# Patient Record
Sex: Male | Born: 1960 | Race: White | Hispanic: No | Marital: Single | Smoking: Never smoker
Health system: Southern US, Community
[De-identification: ages and names within clinical notes are randomized; demographics above are authoritative.]

## PROBLEM LIST (undated history)

## (undated) DIAGNOSIS — Z7289 Other problems related to lifestyle: Secondary | ICD-10-CM

## (undated) DIAGNOSIS — I10 Essential (primary) hypertension: Secondary | ICD-10-CM

## (undated) DIAGNOSIS — I2510 Atherosclerotic heart disease of native coronary artery without angina pectoris: Secondary | ICD-10-CM

## (undated) DIAGNOSIS — E7800 Pure hypercholesterolemia, unspecified: Secondary | ICD-10-CM

## (undated) DIAGNOSIS — F1090 Alcohol use, unspecified, uncomplicated: Secondary | ICD-10-CM

## (undated) DIAGNOSIS — K279 Peptic ulcer, site unspecified, unspecified as acute or chronic, without hemorrhage or perforation: Secondary | ICD-10-CM

## (undated) HISTORY — DX: Atherosclerotic heart disease of native coronary artery without angina pectoris: I25.10

## (undated) HISTORY — DX: Essential (primary) hypertension: I10

## (undated) HISTORY — DX: Peptic ulcer, site unspecified, unspecified as acute or chronic, without hemorrhage or perforation: K27.9

## (undated) HISTORY — PX: OTHER SURGICAL HISTORY: SHX169

## (undated) HISTORY — DX: Pure hypercholesterolemia, unspecified: E78.00

---

## 1997-09-11 ENCOUNTER — Emergency Department (HOSPITAL_COMMUNITY): Admission: EM | Admit: 1997-09-11 | Discharge: 1997-09-12 | Payer: Self-pay | Admitting: Emergency Medicine

## 2007-04-19 ENCOUNTER — Other Ambulatory Visit: Payer: Self-pay

## 2007-04-19 ENCOUNTER — Ambulatory Visit: Payer: Self-pay | Admitting: Cardiology

## 2007-04-19 ENCOUNTER — Other Ambulatory Visit: Payer: Self-pay | Admitting: Emergency Medicine

## 2007-04-19 ENCOUNTER — Inpatient Hospital Stay (HOSPITAL_COMMUNITY): Admission: EM | Admit: 2007-04-19 | Discharge: 2007-04-22 | Payer: Self-pay | Admitting: Cardiology

## 2007-04-19 ENCOUNTER — Encounter: Payer: Self-pay | Admitting: Cardiology

## 2007-05-09 ENCOUNTER — Ambulatory Visit: Payer: Self-pay | Admitting: Cardiology

## 2007-05-24 ENCOUNTER — Encounter (HOSPITAL_COMMUNITY): Admission: RE | Admit: 2007-05-24 | Discharge: 2007-06-27 | Payer: Self-pay | Admitting: Cardiology

## 2007-06-11 ENCOUNTER — Ambulatory Visit: Payer: Self-pay | Admitting: Cardiology

## 2007-06-11 LAB — CONVERTED CEMR LAB
ALT: 27 units/L (ref 0–53)
AST: 21 units/L (ref 0–37)
Alkaline Phosphatase: 62 units/L (ref 39–117)
BUN: 8 mg/dL (ref 6–23)
CO2: 30 meq/L (ref 19–32)
Chloride: 110 meq/L (ref 96–112)
Cholesterol: 146 mg/dL (ref 0–200)
GFR calc non Af Amer: 86 mL/min
LDL Cholesterol: 85 mg/dL (ref 0–99)
Potassium: 4 meq/L (ref 3.5–5.1)
Total Bilirubin: 1.3 mg/dL — ABNORMAL HIGH (ref 0.3–1.2)
Total CHOL/HDL Ratio: 4
Triglycerides: 123 mg/dL (ref 0–149)

## 2007-06-13 ENCOUNTER — Ambulatory Visit: Payer: Self-pay | Admitting: Cardiology

## 2007-07-17 ENCOUNTER — Ambulatory Visit: Payer: Self-pay | Admitting: Cardiology

## 2007-07-20 ENCOUNTER — Ambulatory Visit: Payer: Self-pay | Admitting: Cardiology

## 2007-08-16 ENCOUNTER — Ambulatory Visit: Payer: Self-pay | Admitting: Cardiology

## 2007-08-28 ENCOUNTER — Encounter: Payer: Self-pay | Admitting: Cardiology

## 2007-08-28 ENCOUNTER — Ambulatory Visit: Payer: Self-pay

## 2008-02-06 ENCOUNTER — Ambulatory Visit: Payer: Self-pay | Admitting: Cardiology

## 2008-02-15 ENCOUNTER — Ambulatory Visit: Payer: Self-pay | Admitting: Cardiology

## 2008-02-15 LAB — CONVERTED CEMR LAB
Alkaline Phosphatase: 51 units/L (ref 39–117)
Bilirubin, Direct: 0.1 mg/dL (ref 0.0–0.3)
LDL Cholesterol: 78 mg/dL (ref 0–99)
Total Bilirubin: 1.5 mg/dL — ABNORMAL HIGH (ref 0.3–1.2)
VLDL: 21 mg/dL (ref 0–40)

## 2008-07-15 ENCOUNTER — Encounter (INDEPENDENT_AMBULATORY_CARE_PROVIDER_SITE_OTHER): Payer: Self-pay | Admitting: *Deleted

## 2008-08-30 DIAGNOSIS — E7800 Pure hypercholesterolemia, unspecified: Secondary | ICD-10-CM

## 2008-08-30 DIAGNOSIS — I10 Essential (primary) hypertension: Secondary | ICD-10-CM

## 2008-10-14 ENCOUNTER — Ambulatory Visit: Payer: Self-pay | Admitting: Cardiology

## 2008-11-27 ENCOUNTER — Ambulatory Visit: Payer: Self-pay | Admitting: Cardiology

## 2008-12-09 ENCOUNTER — Telehealth: Payer: Self-pay | Admitting: Cardiology

## 2008-12-10 LAB — CONVERTED CEMR LAB
Albumin: 4.1 g/dL (ref 3.5–5.2)
HDL: 45.8 mg/dL (ref >39.00)
LDL Cholesterol: 89 mg/dL (ref 0–99)
Total CHOL/HDL Ratio: 3
Triglycerides: 110 mg/dL (ref 0.0–149.0)
VLDL: 22 mg/dL (ref 0.0–40.0)

## 2009-04-27 ENCOUNTER — Ambulatory Visit: Payer: Self-pay | Admitting: Cardiology

## 2009-07-23 ENCOUNTER — Ambulatory Visit: Payer: Self-pay | Admitting: Cardiology

## 2009-07-27 ENCOUNTER — Encounter: Payer: Self-pay | Admitting: Cardiology

## 2009-07-27 LAB — CONVERTED CEMR LAB
AST: 28 units/L (ref 0–37)
Alkaline Phosphatase: 50 units/L (ref 39–117)
Bilirubin, Direct: 0.2 mg/dL (ref 0.0–0.3)
Cholesterol: 162 mg/dL (ref 0–200)
LDL Cholesterol: 82 mg/dL (ref 0–99)
Total Bilirubin: 1.1 mg/dL (ref 0.3–1.2)
Total CHOL/HDL Ratio: 3
VLDL: 27 mg/dL (ref 0.0–40.0)

## 2009-11-18 ENCOUNTER — Ambulatory Visit: Payer: Self-pay | Admitting: Cardiology

## 2010-01-04 ENCOUNTER — Telehealth (INDEPENDENT_AMBULATORY_CARE_PROVIDER_SITE_OTHER): Payer: Self-pay | Admitting: *Deleted

## 2010-01-08 ENCOUNTER — Encounter (INDEPENDENT_AMBULATORY_CARE_PROVIDER_SITE_OTHER): Payer: Self-pay | Admitting: *Deleted

## 2010-03-02 NOTE — Progress Notes (Signed)
Summary: refill  Phone Note Refill Request Message from:  Patient on January 04, 2010 1:14 PM  Refill of Lipitor need generic brand send to [facility] [address]  Initial call taken by: Judie Grieve,  January 04, 2010 1:15 PM  Follow-up for Phone Call        Rx called to pharmacy Lipitor generic form # 30 plus 6 refills. Vikki Ports  January 08, 2010 9:12 AM

## 2010-03-02 NOTE — Assessment & Plan Note (Signed)
Summary: [name]   Visit Type:  6 months follow up Primary Provider:  Everlene Other  CC:  no complaints.  History of Present Illness: Doing well.  Denies chest pain.  Wants to go back to atorvastatin when it goes generic.  Has had alot of issues with his mother.  He gets no symptoms when he gets out and about.    Current Medications (verified): 1)  Carvedilol 6.25 Mg Tabs (Carvedilol) .... Take One Tablet By Mouth Twice A Day 2)  Aspirin Ec 325 Mg Tbec (Aspirin) .... Take One Tablet By Mouth Daily 3)  Simvastatin 40 Mg Tabs (Simvastatin) .... Take One Tablet By Mouth Daily At Bedtime 4)  Multivitamins  Tabs (Multiple Vitamin) .... When Remembers 5)  Nitroglycerin 0.4 Mg Subl (Nitroglycerin) .... One Tablet Under Tongue Every 5 Minutes As Needed For Chest Pain---May Repeat Times Three 6)  Ranitidine Hcl 150 Mg Caps (Ranitidine Hcl) .... As Needed 7)  Vitamin B Complex-C   Caps (B Complex-C) .... Not Every Day 8)  Fish Oil 1000 Mg Caps (Omega-3 Fatty Acids) .... Not Every Day  Allergies (verified): No Known Drug Allergies  Vital Signs:  Patient profile:   50 year old male Height:      71 inches Weight:      185 pounds BMI:     25.90 Pulse rate:   71 / minute Pulse rhythm:   regular Resp:     18 per minute BP sitting:   106 / 80  (left arm) Cuff size:   large  Vitals Entered By: Vikki Ports (November 18, 2009 2:52 PM)  Physical Exam  General:  Well developed, well nourished, in no acute distress. Head:  normocephalic and atraumatic Eyes:  PERRLA/EOM intact; conjunctiva and lids normal. Lungs:  Clear bilaterally to auscultation and percussion. Heart:  PMI non displaced.  Normal S1 and S2.  No rub or gallop. Pulses:  pulses normal in all 4 extremities Extremities:  No clubbing or cyanosis. Neurologic:  Alert and oriented x 3.   EKG  Procedure date:  11/18/2009  Findings:      NSR.  WNL.  Impression & Recommendations:  Problem # 1:  CAD (ICD-414.00)  Stable overall.   Denies any symptoms.  We discussed ASA dosing and I told him he could take 81 mg.  Will do GXT on next office visit. His updated medication list for this problem includes:    Carvedilol 6.25 Mg Tabs (Carvedilol) .Marland Kitchen... Take one tablet by mouth twice a day    Aspirin Ec 325 Mg Tbec (Aspirin) .Marland Kitchen... Take one tablet by mouth daily    Nitroglycerin 0.4 Mg Subl (Nitroglycerin) ..... One tablet under tongue every 5 minutes as needed for chest pain---may repeat times three  His updated medication list for this problem includes:    Carvedilol 6.25 Mg Tabs (Carvedilol) .Marland Kitchen... Take one tablet by mouth twice a day    Aspirin Ec 325 Mg Tbec (Aspirin) .Marland Kitchen... Take one tablet by mouth daily    Nitroglycerin 0.4 Mg Subl (Nitroglycerin) ..... One tablet under tongue every 5 minutes as needed for chest pain---may repeat times three  Orders: EKG w/ Interpretation (93000)  Problem # 2:  HYPERCHOLESTEROLEMIA (ICD-272.0)  Near target on generic--wants to switch back when atorva becomes generic. His updated medication list for this problem includes:    Simvastatin 40 Mg Tabs (Simvastatin) .Marland Kitchen... Take one tablet by mouth daily at bedtime  His updated medication list for this problem includes:    Simvastatin 40  Mg Tabs (Simvastatin) .Marland Kitchen... Take one tablet by mouth daily at bedtime  Patient Instructions: 1)  Your physician recommends that you continue on your current medications as directed. Please refer to the Current Medication list given to you today. 2)  Your physician wants you to follow-up in:6 MONTHS  You will receive a reminder letter in the mail two months in advance. If you don't receive a letter, please call our office to schedule the follow-up appointment. 3)  Your physician has requested that you have an exercise tolerance test in 6 MONTHS.  For further information please visit [email or web address]  Please also follow instruction sheet, as given.

## 2010-03-02 NOTE — Letter (Signed)
Summary: Custom - Lipid  [facility], Main Office  [address]   [place], [place] [address]   Phone: [phone]  Fax: [phone]     July 27, 2009 MRN: [id number]   [name] [address] [place], [place]  [address]   Dear [name],  We have reviewed your cholesterol results.  They are as follows:     Total Cholesterol:    162 (Desirable: less than 200)       HDL  Cholesterol:     52.80  (Desirable: greater than 40 for men and 50 for women)       LDL Cholesterol:       82  (Desirable: less than 100 for low risk and less than 70 for moderate to high risk)       Triglycerides:       135.0  (Desirable: less than 150)  Our recommendations include:  All of these look slightly better than the last numbers.  Will continue at present.  TS   Call our office at the number listed above if you have any questions.  Lowering your LDL cholesterol is important, but it is only one of a large number of "risk factors" that may indicate that you are at risk for heart disease, stroke or other complications of hardening of the arteries.  Other risk factors include:   A.  Cigarette Smoking* B.  High Blood Pressure* C.  Obesity* D.   Low HDL Cholesterol (see yours above)* E.   Diabetes Mellitus (higher risk if your is uncontrolled) F.  Family history of premature heart disease G.  Previous history of stroke or cardiovascular disease    *These are risk factors YOU HAVE CONTROL OVER.  For more information, visit .  There is now evidence that lowering the TOTAL CHOLESTEROL AND LDL CHOLESTEROL can reduce the risk of heart disease.  The American Heart Association recommends the following guidelines for the treatment of elevated cholesterol:  1.  If there is now current heart disease and less than two risk factors, TOTAL CHOLESTEROL should be less than 200 and LDL CHOLESTEROL should be less than 100. 2.  If there is current heart disease or two or more risk factors, TOTAL  CHOLESTEROL should be less than 200 and LDL CHOLESTEROL should be less than 70.  A diet low in cholesterol, saturated fat, and calories is the cornerstone of treatment for elevated cholesterol.  Cessation of smoking and exercise are also important in the management of elevated cholesterol and preventing vascular disease.  Studies have shown that 30 to 60 minutes of physical activity most days can help lower blood pressure, lower cholesterol, and keep your weight at a healthy level.  Drug therapy is used when cholesterol levels do not respond to therapeutic lifestyle changes (smoking cessation, diet, and exercise) and remains unacceptably high.  If medication is started, it is important to have you levels checked periodically to evaluate the need for further treatment options.  Thank you,    [employer] Team

## 2010-03-02 NOTE — Miscellaneous (Signed)
Summary: update med  Clinical Lists Changes  Medications: Changed medication from SIMVASTATIN 40 MG TABS (SIMVASTATIN) Take one tablet by mouth daily at bedtime to LIPITOR 80 MG TABS (ATORVASTATIN CALCIUM) Take one tablet by mouth daily.

## 2010-03-02 NOTE — Assessment & Plan Note (Signed)
Summary: ROV   Primary Provider:  Everlene Other   History of Present Illness: Has occasional twinges of pain, but it moves around.   Does alot of walking, and [place]aks in the [place].  He has no pain.  Waiting for warm weather to come.  Tolerating medicines ok.  Seems to be having less symptoms on simvastatin than on Crestor.  He is a Occupational psychologist, and is on the phone.    Current Medications (verified): 1)  Carvedilol 6.25 Mg Tabs (Carvedilol) .... Take One Tablet By Mouth Twice A Day 2)  Aspirin Ec 325 Mg Tbec (Aspirin) .... Take One Tablet By Mouth Daily 3)  Simvastatin 40 Mg Tabs (Simvastatin) .... Take One Tablet By Mouth Daily At Bedtime 4)  Multivitamins  Tabs (Multiple Vitamin) .... When Remembers 5)  Nitroglycerin 0.4 Mg Subl (Nitroglycerin) .... One Tablet Under Tongue Every 5 Minutes As Needed For Chest Pain---May Repeat Times Three 6)  Ranitidine Hcl 150 Mg Caps (Ranitidine Hcl) .... As Needed 7)  Vitamin B Complex-C   Caps (B Complex-C) .... Not Every Day 8)  Fish Oil 1000 Mg Caps (Omega-3 Fatty Acids) .... Not Every Day  Allergies (verified): No Known Drug Allergies  Past History:  Past Medical History: Last updated: 08/30/2008 Current Problems:  CAD (ICD-414.00)- post percutaneous stenting of the left anterior descending artery with residual disease HYPERTENSION (ICD-401.9)-Borderline HYPERCHOLESTEROLEMIA (ICD-272.0)  Vital Signs:  Patient profile:   50 year old male Height:      71 inches Weight:      182 pounds BMI:     25.48 Pulse rate:   69 / minute Resp:     16 per minute BP sitting:   132 / 82  (right arm)  Vitals Entered By: Marrion Coy, CNA (April 27, 2009 4:27 PM)  Physical Exam  General:  Well developed, well nourished, in no acute distress. Head:  normocephalic and atraumatic Eyes:  PERRLA/EOM intact; conjunctiva and lids normal. Ears:  TM's intact and clear with normal canals and hearing Lungs:  Clear bilaterally to auscultation  and percussion. Heart:  PMI non displaced.  Normal S1 and S2.  No S4, no murmur, no rub. Abdomen:  Bowel sounds positive; abdomen soft and non-tender without masses, organomegaly, or hernias noted. No hepatosplenomegaly. Pulses:  pulses normal in all 4 extremities Extremities:  No clubbing or cyanosis. Neurologic:  Alert and oriented x 3.   EKG  Procedure date:  04/27/2009  Findings:      NSR.  WNL.  Impression & Recommendations:  Problem # 1:  CAD (ICD-414.00)  remains stable on a medical regimen.   His updated medication list for this problem includes:    Carvedilol 6.25 Mg Tabs (Carvedilol) .Marland Kitchen... Take one tablet by mouth twice a day    Aspirin Ec 325 Mg Tbec (Aspirin) .Marland Kitchen... Take one tablet by mouth daily    Nitroglycerin 0.4 Mg Subl (Nitroglycerin) ..... One tablet under tongue every 5 minutes as needed for chest pain---may repeat times three  Orders: EKG w/ Interpretation (93000)  Problem # 2:  HYPERCHOLESTEROLEMIA (ICD-272.0)  Need to recheck lipid and liver after a period of exercising more.   His updated medication list for this problem includes:    Simvastatin 40 Mg Tabs (Simvastatin) .Marland Kitchen... Take one tablet by mouth daily at bedtime  Orders: EKG w/ Interpretation (93000)  Problem # 3:  HYPERTENSION (ICD-401.9)  controlled on current regimen. His updated medication list for this problem includes:    Carvedilol 6.25 Mg  Tabs (Carvedilol) .Marland Kitchen... Take one tablet by mouth twice a day    Aspirin Ec 325 Mg Tbec (Aspirin) .Marland Kitchen... Take one tablet by mouth daily  Orders: EKG w/ Interpretation (93000)  Patient Instructions: 1)  Your physician recommends that you return for a FASTING LIPID and LIVER Panel in 3 MONTHS (414.01, 401.9, 272.0) Lab opens at 8:30, July 06, 2009  2)  Your physician recommends that you continue on your current medications as directed. Please refer to the Current Medication list given to you today. 3)  Your physician wants you to follow-up in:  6  MONTHS.  You will receive a reminder letter in the mail two months in advance. If you don't receive a letter, please call our office to schedule the follow-up appointment.

## 2010-06-15 NOTE — Assessment & Plan Note (Signed)
[facility]                            CARDIOLOGY OFFICE NOTE   NAME:Rita, Idrees A                         MRN:          [id number]  DATE:08/16/2007                            DOB:          06-26-1960    Mr. Pagnotta is in for a followup visit.  In general, he is doing really  quite well.  He lives just off a [place] broadcast [place], and goes to the school where  he walked this track several times.  He walks up to 4-5 times per week.  Occasionally, he will have some atypical chest pain while sitting  around.  It goes sometimes in the right axilla and left axillary is  brief, and is not associated with any midsternal chest discomfort,  diaphoresis, nausea, or other typical symptoms.  There is no definite  pattern and none of this is similar to what he has had previously.  The  patient's last lipid profile was done in May.  At that time, his total  cholesterol was 146.  His HDL was slightly low at 36.9.  His LDL  cholesterol was 85, and he was near target.  His liver function studies  were not abnormal.  His renal function studies were intact.  His  previous hemoglobin was within normal limits.   CURRENT MEDICATIONS:  1. Aspirin 325 mg daily.  2. Plavix 75 mg daily.  3. Coreg 6.25 mg b.i.d.  4. Lipitor 80 mg nightly.  5. Multivitamins.   PHYSICAL EXAMINATION:  GENERAL:  He is alert and oriented, in no acute  distress.  VITAL SIGNS:  The blood pressure is 110/70.  The pulse is 76.  RESPIRATORY:  The lung fields are clear.  CARDIAC:  Rhythm is regular.  There is not a significant S4 gallop.   IMPRESSION:  1. Prior myocardial infarction with subsequent percutaneous stenting      of the left anterior descending artery with residual disease with      40-50% narrowing of the ramus intermedius, 70-75% focal stenosis of      an atrioventricular circumflex, and 30% stenosis in the right      coronary territory.  2. Hypercholesterolemia, on lipid-lowering therapy.   PLAN:  1. Return to clinic in 3 months.  2. At that time, his aspirin may be reduced.  3. We will order a 2D echocardiogram to ensure that his overall LV      function has recovered.     Arturo Morton. Riley Kill, MD, [facility]  Electronically Signed    TDS/MedQ  DD: 08/17/2007  DT: 08/17/2007  Job #: [id number]

## 2010-06-15 NOTE — Assessment & Plan Note (Signed)
[facility]                            CARDIOLOGY OFFICE NOTE   NAME:Somma, Harveer A                         MRN:          [id number]  DATE:05/09/2007                            DOB:          1960/08/31    HISTORY OF PRESENT ILLNESS:  Mr. Stripling is in for a followup visit.  In  general, he has been getting along reasonably well, although he  complains of a little bit of low energy, fatigue and at times some  dizziness, especially when he stands up.  He denies any progressive  shortness of breath, and has not had recurrent angina pectoris.  The  patient presented with an acute myocardial infarction.  He had ST  elevation.  He underwent catheterization, and stenting of the LAD with a  non drug-eluting stent.  During his hospitalization he had elevated  cardiac markers with CPK MBs peaking at 273.  His LDL in the hospital  was 135.  His BUN and creatinine were normal at the time of discharge.  Since discharge from the hospital, he has been getting along well.  He  has a sedentary job, and feels like he is ready to go back to work.   CURRENT MEDICATIONS:  1. Aspirin 325 mg daily.  2. Plavix 75 mg daily.  3. Lisinopril 2.5 mg daily.  4. Coreg 6.25 mg p.o. b.i.d.  5. Lipitor 80 mg q.h.s.  6. Multivitamin daily.   PHYSICAL EXAMINATION:  GENERAL:  He is alert and oriented in no  distress.  VITAL SIGNS:  Blood pressure is 195/70 supine and 90/70 standing.  Groin  has healed.  CARDIAC:  Rhythm is regular with an S4 gallop.  LUNGS:  The lung fields are clear to auscultation and percussion.   STUDIES:  Electrocardiogram demonstrates evidence of an age  indeterminate anterior wall myocardial infarction.  These are compatible  with his recent infarction.   IMPRESSION:  1. Mild hypotension status post percutaneous coronary stenting of the      LAD  2. Hypercholesterolemia now on lipid lowering therapy  3..  Stable cardiac status.   PLAN:  1. Discontinue  Lisinopril.  2. Return to clinic in 2 weeks for an exercise treadmill study.  3. Lipid and liver profile and basic metabolic profile.   ADDENDUM:  We spent nearly 20 minutes filling out disability forms  today.     Arturo Morton. Riley Kill, MD, [facility]  Electronically Signed    TDS/MedQ  DD: 05/09/2007  DT: 05/10/2007  Job #: [id number]

## 2010-06-15 NOTE — H&P (Signed)
[name], [name] NO.:  [id number]   MEDICAL RECORD NO.:  [id number]          PATIENT TYPE:  OIB   LOCATION:  2899                         FACILITY:  [facility]   PHYSICIAN:  Joseph Morton. Riley Kill, MD, FACCDATE OF BIRTH:  [birth year]   DATE OF ADMISSION:  04/19/2007  DATE OF DISCHARGE:                              HISTORY & PHYSICAL   PRIMARY CARDIOLOGIST:  [facility]   PRIMARY CARE PHYSICIAN:  Dr. Francie Massing at [facility].   PATIENT PROFILE:  A 50 year old Caucasian male without prior history of  CAD.  Patient was in his usual state of health until approximately 3:30  this morning when he awoke with substernal chest pain, nausea and  vomiting x2.  He then went back to bed and got back up at about 9 a.m.  secondary to worsening chest pain, nausea and dyspnea.  He presented to  the [facility] ER at 10:57 a.m. where and ECG was performed  showing anterior ST segment elevation with inferior and lateral  reciprocal changes.  Code STEMI was activated and patient was treated  with 4 baby aspirin, morphine, nitroglycerin.  He was taken [facility] cath lab for emergent cardiac catheterization and  arrived her at 11:38 this morning.  He currently complains of 3-4 out of  10 bilateral arm pain.   ALLERGIES:  No known drug allergies.   HOME MEDICATIONS:  Atenolol daily.   FAMILY HISTORY:  Both parents are alive.  Mother has a history of CAD.  She is currently age 18.  Father has a history of CAD and MI in his 63s,  and he is currently 27.  He has a brother age 19 who had an MI about 8  months ago.   SOCIAL HISTORY:  He lives in [place] by himself.  He has never been  married and has no children.  He works in Clinical biochemist for [employer].  He denies any tobacco or drug use.  He drinks between 6-  and 12-pack per week.  He does not routinely exercise.   REVIEW OF SYSTEMS:  Positive for chest pain, shortness of  breath, nausea  and vomiting as outlined in the HPI.  Otherwise all systems reviewed and  negative.   PHYSICAL EXAMINATION:  VITAL SIGNS:  Heart rate 85, respirations 24,  blood pressure 157/116, pulse ox 99% on 2 L.  GENERAL:  Pleasant white male with complaints of 3-4 out of 10 bilateral  arm pain.  HEENT:  Normal.  NEURO:  He is grossly intact, nonfocal.  SKIN:  Warm and dry without lesions or masses.  NECK:  No bruits or JVD.  LUNGS:  Respirations are regular, unlabored.  Clear to auscultation.  CARDIAC:  Regular S1 and S2.  No S3.  Positive S4.  No murmurs.  ABDOMEN:  Round, soft, nontender, nondistended.  Bowel sounds present  x4.  EXTREMITIES:  Warm, dry, pink.  No clubbing, cyanosis or edema.  Dorsalis pedis and posterior tibial pulses 2+ and equal bilaterally.   Chest x-ray pending.  EKG shows sinus rhythm with 3 mm anterior ST  segment elevation with lateral and inferior reciprocal changes.  Lab  work is pending.   ASSESSMENT/PLAN:  1. Acute anterior ST elevation myocardial infarction.  Emergent      catheterization today.  Plan to add aspirin, Plavix, statin, beta      blocker, angiotensin-converting enzyme inhibitor.  Follow up 2-      dimensional echocardiogram to evaluate left ventricular function.      Eventual cardiac rehabilitation.  2. Hypertension.  Blood pressure markedly elevated in the acute      setting.  Will add beta blocker and angiotensin-converting enzyme      inhibitor.  3. Lipid status currently unknown.  Reportedly has a history of      hyperlipidemia but not on meds.  Check lipids and liver function      tests.  Add Lipitor 80.      [name], ANP      Joseph Morton. Riley Kill, MD, [facility]  Electronically Signed    CB/MEDQ  D:  04/19/2007  T:  04/19/2007  Job:  [id number]

## 2010-06-15 NOTE — Discharge Summary (Signed)
NAMEKRISTEN, [name] NO.:  [id number]   MEDICAL RECORD NO.:  [id number]          PATIENT TYPE:  INP   LOCATION:  3705                         FACILITY:  [facility]   PHYSICIAN:  Madolyn Frieze. Jens Som, MD, FACCDATE OF BIRTH:  02/24/1960   DATE OF ADMISSION:  04/19/2007  DATE OF DISCHARGE:  04/22/2007                         DISCHARGE SUMMARY - REFERRING   ADMITTING PHYSICIAN:  Arturo Morton. Riley Kill, MD, [facility].   DISCHARGE PHYSICIAN:  Madolyn Frieze. Jens Som, MD, [facility].   SUMMARY OF HISTORY:  Mr. [name] is a 50 year old male who was awakened at  3:30 in the morning with substernal chest discomfort associated with  nausea and vomiting.  He was able to return to sleep, however, again at  9 a.m. he awoke to worsening chest discomfort.  He presented to [facility] at 10:57 a.m. where an EKG showed ST-segment elevation  anteriorly.  Code STEMI was called and he was rushed emergently to the  cardiac catheterization.  His history is notable for hypertension and  hyperlipidemia and a bleeding ulcer in the 1980s.   LABORATORY DATA:  Subsequent EKGs showed evolving anterior myocardial  infarction.  Admission H and H was 16.8 and 47.7, normal indices,  platelets 221, WBCs 11.6, PTT 30, PT 12.5, sodium 139, potassium 4.4,  BUN 7, creatinine 1.30.  LFTs showed a total bilirubin slightly elevated  at 1.4 and an AST at 38.  On March 22 at the time of discharge sodium  was 138, potassium 4.7, BUN 14, creatinine 1.01, glucose 91.  Initial CK-  MB was 1773 and 273.0 and a relative index of 15.4.  Troponin was 33.16.  Subsequent CK-MBs and troponins were declining.  BNP on admission was  139.  Fasting lipids showed a total cholesterol 208, triglycerides 155,  HDL 42, LDL 35.  A chest x-ray on the 19th was felt to be normal.   HOSPITAL COURSE:  Dr. Riley Kill performed emergent cardiac  catheterization.  His ejection fraction was noted to be 35% with  anterior apical and inferior dyskinesis.   Dr. Riley Kill performed bare  metal stenting to the proximal LAD reducing this from 100% to 0%.  His  residual coronary artery disease was felt to need continued medical  treatment.  An echocardiogram on the 19th showed an EF of 55%, mid  distal anterior septal akinesis.  Apical akinesis, moderate diastolic  dysfunction, trivial TR.  He was placed into the protection AMI trial.  Post sheath removal and bedrest catheterization site was intact thus he  was ambulating without difficulty.  Cardiac rehab assisted with  education and ambulation.  financial counsel also contacted the patient  and a family member to assist with needs.  Dr. Antoine Poche educated the  patient about limiting his ETOH and on the 22nd Dr. Jens Som felt that  he could be discharged home.   DISCHARGE DIAGNOSES:  1. Anterior myocardial infarction.  2. Three vessel nonobstructive coronary artery disease.  3. Status post bare metal stent to the LAD.  4. Hypertension.  5. Hyperlipidemia.  6. Alcohol use.   PROCEDURES PERFORMED:  Cardiac catheterization  with bare metal stenting  to the proximal LAD, continued medical treatment for a residual three  vessel nonobstructive disease.   DISPOSITION:  The patient is discharged home and asked to maintain a low  salt, heart healthy diet.  He was told not to return to work until after  he sees a Development worker, community.  Wound care is per supplemental sheet.  He was  advised no lifting, driving, sexual activity or heavy exertion for 2  weeks.   PRESCRIPTIONS:  1. He received new prescriptions for aspirin 325 mg daily.  2. Plavix 75 mg daily.  3. Lisinopril 2.5 mg daily.  4. Coreg 6.25 mg b.i.d.  5. Lipitor 80 mg daily at bedtime.  6. Nitroglycerin 0.4 as needed.   He will need blood work in 6-8 weeks in regards to FLP and LFTs.  He was  asked to bring all medications to all appointments.  He was advised not  to take his atenolol.  Discharge time 35 minutes.      Joellyn Rued,  PA-C      Madolyn Frieze Jens Som, MD, [facility]  Electronically Signed    EW/MEDQ  D:  04/22/2007  T:  04/22/2007  Job:  [id number]   cc:   Francie Massing, Dr

## 2010-06-15 NOTE — Cardiovascular Report (Signed)
NAMETEAGHAN, FORMICA NO.:  [id number]   MEDICAL RECORD NO.:  [id number]          PATIENT TYPE:  INP   LOCATION:  2901                         FACILITY:  [facility]   PHYSICIAN:  Arturo Morton. Riley Kill, MD, FACCDATE OF BIRTH:  1960/04/19   DATE OF PROCEDURE:  04/19/2007  DATE OF DISCHARGE:                            CARDIAC CATHETERIZATION   INDICATIONS:  Mr. Lightner is a very pleasant 50 year old gentleman who  presents with an acute anterior wall infarction.  He was picked up by  EMS.  He had been having chest pain for several hours.  It had started  last night, gone away, and then resumed this morning.  He presented to  the [facility] emergency room.  The EKG was placed, although the limb  leads were reversed.  However anterior precordial leads were consistent  with an anterior wall infarction, and he was brought emergently to the  cardiac catheterization laboratory at [facility].   On arrival here, he was evaluated carefully.  He was electively enrolled  in the PROTECTION AMI protocol.  Catheterization procedure was explained  to the patient.   PROCEDURE:  1. Left heart catheterization  2. Selective coronary arteriography.  3. Selective left ventriculography.  4. Percutaneous stenting of the left anterior descending artery.   DESCRIPTION OF THE PROCEDURE:  The patient was brought to the  catheterization laboratory and prepped and draped in the usual fashion.  Through an anterior puncture, the femoral artery was easily entered and  a 6-French sheath was placed.  We then did views of the left and right  coronary arteries.  Following this, preparations were made for  percutaneous intervention.  Bivalirudin was given according to protocol.  Heparin had not been given in the field.  Oral clopidogrel was also  administered.  A JL-35 guiding catheter was utilized to intubate the  left main.  We waited for the ACT to reach 200 seconds, and then a  Prowater wire  was placed down the artery and across the site of total  occlusion.  Following this, dilatation was done with a 2.0 x 15 Maverick  balloon.  Re-establishment of flow was achieved with this.  There was  evidence of vessel dissection from the original heart attack.  Following  this, we carefully measured the length of the lesion, and elected to  place a 2.75 x 28 Vision stent.  The Vision stent was then placed down  the artery and inflated to 15 atmospheres.  There was marked improvement  in the appearance of the artery with establishment of TIMI III flow.  Following this, post dilatations were done with a 3.25 Quantum Maverick  up to 16 atmospheres in the central portion of the stent particularly.  The entire stent was post dilated.  Following this, we gave  intracoronary verapamil.  Central aortic and left ventricular pressures  were then measured with a pigtail and ventriculography was performed in  the RAO projection.  The patient's symptoms were relieved.  He was  subsequently taken off the table, taken to the CCU in satisfactory  clinical condition.  During the course of the procedure, the patient was  randomized in the PROTECTION AMI trial.   HEMODYNAMIC DATA:  1. Central aortic pressure was 140/99, mean 118.  2. Left ventricular pressure was 145/34.  Importantly, the patient was      given intravenous metoprolol, intracoronary nitroglycerin and      intracoronary verapamil during the course of the procedure to      improve coronary flow and to bring pressure down.   ANGIOGRAPHIC DATA:  1. The left main is free of critical disease.  2. The LAD is occluded after the diagonal.  The diagonal itself has      about 40% narrowing.  The area of occlusion is a long segmentally      diseased area.  Following balloon dilatation and stenting with a 28-      mm stent, there was re-establishment of TIMI III flow, and also      marked improvement in the appearance of the artery with a  reduction      from 100 to 0%.  Just distal to the stent site is an area of mild      luminal irregularity of about 30%, and with reperfusion there was      evidence of an apical 40% stenosis.  3. Circumflex demonstrates a ramus intermedius that has about 40%      narrowing, then a 50% lesion.  The AV circumflex has a focal 70-75%      stenosis and diffuse luminal irregularity distally.  4. The right coronary artery demonstrates about a 30% mid stenosis and      has diffuse luminal irregularity but no critical lesions.  5. Ventriculography in the RAO projection reveals an ejection fraction      that appears to be in the range of about 35%.  There is dyskinesis      of the anteroapical segment and distal inferior segment.  I would      estimate the ejection fraction at between 35 and 40%.   The patient arrived at [facility] at 11:16, he was in the  catheterization laboratory at 11:39, and balloon inflation was performed  at 12:14.  This yields a door to balloon time of 58 minutes including  interhospital transfer.  Cath lab to balloon time was 35 minutes.   CONCLUSIONS:  1. Acute anterior wall myocardial infarction due to mid occlusion of      the left anterior descending artery.  2. Successful reperfusion with stenting of the LAD with a non drug-      eluting platform with post dilatation using a 3.25 Quantum Maverick      balloon.  3. Residual disease of the circumflex.  4. Moderately severe reduction in global left ventricular function      with an estimate of the ejection fraction of 35-40%.   PLAN:  1. The patient will be treated with aspirin and Plavix.  2. ACE inhibition will be begun immediately.  3. Beta blockade with Coreg will be administered.  4. Lipitor will be given at 80 mg.  5. Cardiac rehabilitation will be recommended.      Arturo Morton. Riley Kill, MD, [facility]  Electronically Signed     TDS/MEDQ  D:  04/19/2007  T:  04/19/2007  Job:  [id number]   cc:    Tracey Harries, M.D.

## 2010-06-15 NOTE — Procedures (Signed)
[facility]                              EXERCISE TREADMILL   NAME:Ayoub, Gilmore A                         MRN:          [id number]  DATE:06/13/2007                            DOB:          10-21-1960    Mr. Aburto exercised today on the Bruce protocol.  Blood pressure rose  appropriately.  Heart rate went from 75 at rest to 146 which represents  83% of age-predicted maximum.  He experienced no chest pain.  The  resting electrocardiogram demonstrated normal sinus rhythm, and at  maximum stress, there were absolutely no ST-segment changes.  The  patient has been in the cardiac rehabilitation program.  The patient has  also had recent lipid profile with an LDL of 85, triglycerides 123,  total cholesterol 146.  His liver function studies are within normal  limits.  His renal function studies are normal as well.   The patient is a 50 year old who presented with an anterior wall  infarction.  He was picked up by EMS.  He had a 2.75 x 28 Vision non  drug-eluting stent placed with marked improvement in TIMI flow.  He had  aggressive post dilatation with no evidence of edge tear.  He was  randomized in the protection AMI trial.  His coronary angiogram revealed  a long area of segmental disease that was successfully stented.  There  was mild luminal irregularity distally with 30% narrowing and 40% apical  stenosis.  There was a ramus intermedius that had 40% narrowing, then a  50% lesion, and the AV circumflex had a focal 70-75% stenosis and  diffuse luminal irregularity.  The right coronary demonstrated about 30%  narrowing.  The patient has been treated with aspirin and Plavix, and  beta blockade.  He has been in the rehab program.  He has been placed on  Lipitor and has been tolerating this reasonably well.  His lisinopril  has been discontinued because of his borderline blood pressure which we  did approximately 2-3 weeks ago.  Based upon the current findings,  we  will continue to recommend medical therapy.  He will be seen in followup  in 6 weeks to 3 months' time.     Arturo Morton. Riley Kill, MD, [facility]  Electronically Signed    TDS/MedQ  DD: 06/17/2007  DT: 06/17/2007  Job #: [id number]

## 2010-06-15 NOTE — Assessment & Plan Note (Signed)
[facility]                            CARDIOLOGY OFFICE NOTE   NAME:Furgerson, Tige A                         MRN:          [id number]  DATE:02/06/2008                            DOB:          August 29, 1960    Mr. Neuner is in for followup visit.  In general, he has been doing  really quite well.  He has resumed drinking some of his beer, however.  In general, he said he did this during the past year.  His last  echocardiogram does demonstrate some improvement in his overall LV  function with an ejection fraction now in the 55% range.   CURRENT MEDICATIONS:  1. Aspirin 325 mg daily.  2. Plavix 75 mg daily.  3. Coreg 6.25 mg p.o. b.i.d.  4. Lipitor 80 mg nightly.  5. Multivitamin daily.   PHYSICAL EXAMINATION:  GENERAL:  He is alert and oriented, in no  distress.  VITAL SIGNS:  Blood pressure 131/97 and pulse 72.  LUNGS:  Lung fields are clear.  CARDIAC:  Rhythm is regular with an S4 gallop.  EXTREMITIES:  No edema.   Electrocardiogram demonstrates normal sinus rhythm essentially within  normal limits.  When compared to the previous tracing, his entire  anterior T-wave inversion is essentially resolved.  It is now improved.   IMPRESSION:  1. Coronary artery disease, status post percutaneous stenting of the      left anterior descending artery with residual disease, 40-50% of      the ramus intermedius, 70-75% of the atrioventricular circumflex,      and 30% stenosis of the right.  2. Hypercholesterolemia, on lipid-lowering therapy, near target with      LDL of 85 and HDL of 36.  3. Hypertension, borderline control.   RECOMMENDATIONS:  1. We had a discussion today about his beer ingestion, reduction of      this would be highly recommended.  2. We will see him back in followup in 6 months.  At the end of 1      year, the patient will be able to cut back on his Plavix and resume      only aspirin a day, and we will reduce this to 81 mg.  3. He  will need to keep an eye on his blood pressure which we will      check when he returns.     Arturo Morton. Riley Kill, MD, [facility]  Electronically Signed   TDS/MedQ  DD: 02/06/2008  DT: 02/07/2008  Job #: [id number]

## 2010-07-08 ENCOUNTER — Other Ambulatory Visit: Payer: Self-pay | Admitting: *Deleted

## 2010-07-08 MED ORDER — SIMVASTATIN 40 MG PO TABS: 40.0000 mg | ORAL_TABLET | Freq: Every evening | ORAL | Status: DC

## 2010-08-09 ENCOUNTER — Telehealth: Payer: Self-pay | Admitting: Cardiology

## 2010-08-09 MED ORDER — NITROGLYCERIN 0.4 MG SL SUBL: 0.4000 mg | SUBLINGUAL_TABLET | SUBLINGUAL | Status: DC | PRN

## 2010-08-09 MED ORDER — CARVEDILOL 6.25 MG PO TABS: 6.2500 mg | ORAL_TABLET | Freq: Two times a day (BID) | ORAL | Status: DC

## 2010-08-09 NOTE — Telephone Encounter (Signed)
Pharmacy calling needing refills for the following :  Carteolol 6.25mg   Nitrostat 0.4mg 

## 2010-10-25 LAB — CBC
HCT: 44.2
MCHC: 34.8
MCHC: 35.3
MCV: 90.7
RBC: 4.87
RBC: 5.34

## 2010-10-25 LAB — POCT I-STAT, CHEM 8
HCT: 46
Hemoglobin: 15.6
Potassium: 4.1
Sodium: 138

## 2010-10-25 LAB — DIFFERENTIAL
Eosinophils Absolute: 0.1
Eosinophils Relative: 1
Lymphs Abs: 1.3
Monocytes Relative: 5

## 2010-10-25 LAB — BASIC METABOLIC PANEL
BUN: 14
CO2: 28
CO2: 30
Calcium: 8.7
Calcium: 8.9
Creatinine, Ser: 0.99
Creatinine, Ser: 1.01
GFR calc Af Amer: >60
GFR calc Af Amer: >60
Glucose, Bld: 91

## 2010-10-25 LAB — LIPID PANEL
HDL: 42
Triglycerides: 155 — ABNORMAL HIGH
VLDL: 31

## 2010-10-25 LAB — COMPREHENSIVE METABOLIC PANEL
ALT: 33
AST: 38 — ABNORMAL HIGH
CO2: 27
Calcium: 10.6 — ABNORMAL HIGH
GFR calc Af Amer: >60
GFR calc non Af Amer: 59 — ABNORMAL LOW
Sodium: 139
Total Protein: 7.6

## 2010-10-25 LAB — CARDIAC PANEL(CRET KIN+CKTOT+MB+TROPI)
CK, MB: 273 — ABNORMAL HIGH
Total CK: 1773 — ABNORMAL HIGH
Total CK: 865 — ABNORMAL HIGH
Troponin I: 24

## 2010-10-25 LAB — POCT CARDIAC MARKERS
CKMB, poc: 12.5
Myoglobin, poc: 127

## 2010-12-03 ENCOUNTER — Encounter: Payer: Self-pay | Admitting: *Deleted

## 2010-12-07 ENCOUNTER — Encounter: Payer: Self-pay | Admitting: Cardiology

## 2010-12-07 ENCOUNTER — Ambulatory Visit (INDEPENDENT_AMBULATORY_CARE_PROVIDER_SITE_OTHER): Payer: 59 | Admitting: Cardiology

## 2010-12-07 DIAGNOSIS — I2510 Atherosclerotic heart disease of native coronary artery without angina pectoris: Secondary | ICD-10-CM

## 2010-12-07 DIAGNOSIS — E7800 Pure hypercholesterolemia, unspecified: Secondary | ICD-10-CM

## 2010-12-07 DIAGNOSIS — I10 Essential (primary) hypertension: Secondary | ICD-10-CM

## 2010-12-07 MED ORDER — CARVEDILOL 6.25 MG PO TABS: 6.2500 mg | ORAL_TABLET | Freq: Two times a day (BID) | ORAL | Status: DC

## 2010-12-07 MED ORDER — SIMVASTATIN 40 MG PO TABS: 40.0000 mg | ORAL_TABLET | Freq: Every evening | ORAL | Status: DC

## 2010-12-07 NOTE — Patient Instructions (Signed)
Your physician recommends that you return for lab work in: fasting cholesterol, lipid  Your physician wants you to follow-up in: 6 months with Dr. Riley Kill. You will receive a reminder letter in the mail two months in advance. If you don't receive a letter, please call our office to schedule the follow-up appointment.

## 2010-12-09 ENCOUNTER — Other Ambulatory Visit (INDEPENDENT_AMBULATORY_CARE_PROVIDER_SITE_OTHER): Payer: 59 | Admitting: *Deleted

## 2010-12-09 DIAGNOSIS — E7800 Pure hypercholesterolemia, unspecified: Secondary | ICD-10-CM

## 2010-12-09 LAB — HEPATIC FUNCTION PANEL
ALT: 31 U/L (ref 0–53)
AST: 23 U/L (ref 0–37)
Albumin: 4.4 g/dL (ref 3.5–5.2)
Alkaline Phosphatase: 59 U/L (ref 39–117)
Bilirubin, Direct: 0.2 mg/dL (ref 0.0–0.3)
Total Protein: 7.1 g/dL (ref 6.0–8.3)

## 2010-12-09 LAB — LIPID PANEL
Total CHOL/HDL Ratio: 3
Triglycerides: 131 mg/dL (ref 0.0–149.0)

## 2010-12-12 NOTE — Progress Notes (Signed)
HPI:  Mr. [name] is in for follow up.  He has had no tightness in the chest with exertion.  He has noted some epigastric discomfort from time to time, and it is relieved with Pepto-Bismol.  It happened like this in 2004--somehow related to caffeine he thought.  It also goes away with water.  He is sure this is not what he had during his MI.  He is able to ambulate without limitation.  Current Outpatient Prescriptions  Medication Sig Dispense Refill  . aspirin 325 MG tablet Take 325 mg by mouth daily.        . carvedilol (COREG) 6.25 MG tablet Take 1 tablet (6.25 mg total) by mouth 2 (two) times daily.  60 tablet  11  . nitroGLYCERIN (NITROSTAT) 0.4 MG SL tablet Place 1 tablet (0.4 mg total) under the tongue every 5 (five) minutes as needed for chest pain.  25 tablet  9  . ranitidine (ZANTAC) 150 MG capsule Take 150 mg by mouth as needed.        . simvastatin (ZOCOR) 40 MG tablet Take 1 tablet (40 mg total) by mouth every evening.  30 tablet  6    Allergies not on file  Past Medical History  Diagnosis Date  . Coronary artery disease   . Hypertension   . Hypercholesteremia     Past Surgical History  Procedure Date  . Coronary stent placement     of the left anterior descending artery    Family History  Problem Relation Age of Onset  . Coronary artery disease Mother 48    alive  . Coronary artery disease Father     had MI in his 16's  . Heart attack Brother 51    alive    History   Social History  . Marital Status: Single    Spouse Name: N/A    Number of Children: N/A  . Years of Education: N/A   Occupational History  . 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 Smoking status: Never Smoker   . Smokeless tobacco: Not on file  . Alcohol Use: Yes     drinks between 6-12 pack per week  . Drug Use: No  . Sexually Active: Not on file   Other Topics Concern  . Not on file   Social History Narrative  . No narrative on file    ROS: Please see the  HPI.  All other systems reviewed and negative.  PHYSICAL EXAM:  BP 126/86  Pulse 76  Ht 5\' 10"  (1.778 m)  Wt 85.276 kg (188 lb)  BMI 26.98 kg/m2  General: Well developed, well nourished, in no acute distress. Head:  Normocephalic and atraumatic. Neck: no JVD Lungs: Clear to auscultation and percussion. Heart: Normal S1 and S2.  No murmur, rubs or gallops.  Abdomen:  Normal bowel sounds; soft; non tender; no organomegaly Pulses: Pulses normal in all 4 extremities. Extremities: No clubbing or cyanosis. No edema. Neurologic: Alert and oriented x 3.  EKG:  NSR. WNL.   ASSESSMENT AND PLAN:

## 2010-12-14 NOTE — Assessment & Plan Note (Addendum)
Seems to be doing well.  Prior stent to the mid LAD.  Follow up echo showed substantial recovery of LV.  No typical symptoms of angina at present.  Continue ambulation and reg exercise. Suggested he reduce ASA to 81 mg per day.

## 2010-12-14 NOTE — Assessment & Plan Note (Signed)
Overdo for lipid and liver profile.  Will check.

## 2010-12-14 NOTE — Assessment & Plan Note (Signed)
Appears controlled.  Continue meds.

## 2010-12-16 DIAGNOSIS — I2510 Atherosclerotic heart disease of native coronary artery without angina pectoris: Secondary | ICD-10-CM

## 2010-12-16 NOTE — Telephone Encounter (Signed)
[name] He is getting a lipid, but can you set him up for a routine GXT. Thanks. TS  Notes Recorded by Shawnie Pons, MD on 12/14/2010 at 10:55 AM Please let him know his LDL is pretty good. He is close to target at this point in time. Target is 70. If he wants to get here, we could change his simva to atrovastatin 40mg  daily, and recheck lipid and liver in six weeks. TS  I left a message for the pt to call back. Order placed for GXT.

## 2010-12-22 ENCOUNTER — Telehealth: Payer: Self-pay

## 2010-12-22 DIAGNOSIS — E7800 Pure hypercholesterolemia, unspecified: Secondary | ICD-10-CM

## 2010-12-22 MED ORDER — ATORVASTATIN CALCIUM 40 MG PO TABS: 40.0000 mg | ORAL_TABLET | Freq: Every day | ORAL | Status: DC

## 2010-12-22 NOTE — Telephone Encounter (Signed)
error 

## 2010-12-22 NOTE — Telephone Encounter (Signed)
I spoke with the pt and reviewed the pt's lab results.   Note from Dr Riley Kill: Notes Recorded by Shawnie Pons, MD on 12/14/2010 at 10:55 AM Please let him know his LDL is pretty good. He is close to target at this point in time. Target is 70. If he wants to get here, we could change his simva to atrovastatin 40mg  daily, and recheck lipid and liver in six weeks. TS  The pt will stop Simvastatin and start Atorvastatin 40mg  daily.  The pt will have a lipid and liver rechecked on 02/03/11.

## 2011-01-20 ENCOUNTER — Ambulatory Visit (INDEPENDENT_AMBULATORY_CARE_PROVIDER_SITE_OTHER): Payer: 59 | Admitting: Physician Assistant

## 2011-01-20 DIAGNOSIS — I2510 Atherosclerotic heart disease of native coronary artery without angina pectoris: Secondary | ICD-10-CM

## 2011-01-20 NOTE — Progress Notes (Signed)
Exercise Treadmill Test  Pre-Exercise Testing Evaluation Rhythm: normal sinus  Rate: 79   PR:  .14 QRS:  .09  QT:  .37 QTc: .42     Test  Exercise Tolerance Test Ordering MD: Shawnie Pons, MD  Interpreting MD:  Tereso Newcomer PA-C  Unique Test No: 1  Treadmill:  1  Indication for ETT: known ASHD  Contraindication to ETT: No   Stress Modality: exercise - treadmill  Cardiac Imaging Performed: non   Protocol: standard Bruce - maximal  Max BP: 197/89  Max MPHR (bpm):  170 85% MPR (bpm):  144  MPHR obtained (bpm):  162 % MPHR obtained:  95%  Reached 85% MPHR (min:sec):  5:30 Total Exercise Time (min-sec):  6:43  Workload in METS:  8.0 Borg Scale: 17  Reason ETT Terminated:  desired heart rate attained    ST Segment Analysis At Rest: normal ST segments - no evidence of significant ST depression With Exercise: significant ischemic ST depression  Other Information Arrhythmia:  No Angina during ETT:  absent (0) Quality of ETT:  diagnostic  ETT Interpretation:  abnormal - evidence of ST depression consistent with ischemia  Comments: Fair exercise tolerance. No chest pain. Normal BP response to exercise. Infero-lateral ST depression consistent with ischemia.   Recommendations: Discussed with Dr.  Shawnie Pons by telephone. He would like patient to see him in the office tomorrow. Discussed with the patient and he will be set up with an appointment. He takes ASA.  He has NTG.   BP up today.  Optimal in the past.  He will take his Coreg when he gets home. Tereso Newcomer, PA-C  9:46 AM 01/20/2011

## 2011-01-21 ENCOUNTER — Ambulatory Visit (INDEPENDENT_AMBULATORY_CARE_PROVIDER_SITE_OTHER): Payer: 59 | Admitting: Cardiology

## 2011-01-21 ENCOUNTER — Encounter: Payer: Self-pay | Admitting: Cardiology

## 2011-01-21 DIAGNOSIS — I10 Essential (primary) hypertension: Secondary | ICD-10-CM

## 2011-01-21 DIAGNOSIS — I2510 Atherosclerotic heart disease of native coronary artery without angina pectoris: Secondary | ICD-10-CM

## 2011-01-21 NOTE — Patient Instructions (Signed)
Your physician has requested that you have en exercise stress myoview. For further information please visit [email or web address]. Please follow instruction sheet, as given.   Your physician recommends that you continue on your current medications as directed. Please refer to the Current Medication list given to you today. 

## 2011-01-21 NOTE — Assessment & Plan Note (Addendum)
Current findings suggest a mildly abnormal stress test at peak exercise, in a patient with no symptoms. In part, because of his two-vessel disease, with prior stenting of the left anterior descending artery and a 70% stenosis of the circumflex, combined with his desire to remain active, I think it would be most appropriate to consider stress imaging. The alternative to this would be to consider cardiac catheterization, but he is totally asymptomatic and am reluctant to start beyond this at this point in time. Because of his age, and his moderate activity, some risk stratification I would be very helpful in his long-term management. I reviewed this in great detail with the patient, and he is agreeable to this plan. If his insurer consents, I will proceed in this direction.

## 2011-01-21 NOTE — Assessment & Plan Note (Signed)
His blood pressure was admitted yesterday on stress testing, but he did not take his carvedilol yesterday.  He would resume this.

## 2011-01-21 NOTE — Progress Notes (Signed)
HPI:  [name] returns in followup. He underwent stress testing yesterday, and at peak exercise, he had onset of inferolateral ST depression. He was modestly hypertensive at that time. At 1 minute into recovery, these EKG changes resolved. The study was performed in part because the patient had a prior myocardial infarction, and had stenting of left anterior descending artery. He had residual disease involving the cir M.D. and discussed with him the current National [employer] of [employer] study known as ischemia cumflex coronary artery. He's been active, and the stress test was done in large part because of his desire to remain active, and his known residual disease. He does not have exertional angina. He and I discussed this at great length today, covering a history of angioplasty, the results of [facility]age, and strategies employed. I am reluctant to recommend repeat cardiac catheterization, as he has virtually no angina, and he is a mildly abnormal stress test. However, because of his two-vessel disease, it would seem most appropriate to consider radionuclide imaging to his best stratify him into higher and lower risk. We met for nearly 30 minutes.  Current Outpatient Prescriptions  Medication Sig Dispense Refill  . aspirin 81 MG chewable tablet Chew 81 mg by mouth daily.        Marland Kitchen atorvastatin (LIPITOR) 40 MG tablet Take 1 tablet (40 mg total) by mouth daily.  30 tablet  11  . carvedilol (COREG) 6.25 MG tablet Take 1 tablet (6.25 mg total) by mouth 2 (two) times daily.  60 tablet  11  . nitroGLYCERIN (NITROSTAT) 0.4 MG SL tablet Place 1 tablet (0.4 mg total) under the tongue every 5 (five) minutes as needed for chest pain.  25 tablet  9  . ranitidine (ZANTAC) 150 MG capsule Take 150 mg by mouth as needed.          No Known Allergies  Past Medical History  Diagnosis Date  . Coronary artery disease   . Hypertension   . Hypercholesteremia     Past Surgical History  Procedure Date  . Coronary  stent placement     of the left anterior descending artery    Family History  Problem Relation Age of Onset  . Coronary artery disease Mother 24    alive  . Coronary artery disease Father     had MI in his 7's  . Heart attack Brother 51    alive    History   Social History  . Marital Status: Single    Spouse Name: N/A    Number of Children: N/A  . Years of Education: N/A   Occupational History  . 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 Smoking status: Never Smoker   . Smokeless tobacco: Not on file  . Alcohol Use: Yes     drinks between 6-12 pack per week  . Drug Use: No  . Sexually Active: Not on file   Other Topics Concern  . Not on file   Social History Narrative  . No narrative on file    ROS: Please see the HPI.  All other systems reviewed and negative.  PHYSICAL EXAM:  BP 137/91  Pulse 81  Resp 18  Ht 5\' 10"  (1.778 m)  Wt 87.454 kg (192 lb 12.8 oz)  BMI 27.66 kg/m2  General: Well developed, well nourished, in no acute distress. Head:  Normocephalic and atraumatic. Neck: no JVD Lungs: Clear to auscultation and percussion. Heart: Normal S1 and S2.  No murmur, rubs or gallops.  Pulses: Pulses normal in all 4 extremities. Extremities: No clubbing or cyanosis. No edema. Neurologic: Alert and oriented x 3.  EKG:  See exercise stress test. ASSESSMENT AND PLAN:

## 2011-02-03 ENCOUNTER — Encounter (HOSPITAL_COMMUNITY): Payer: 59 | Admitting: Radiology

## 2011-02-03 ENCOUNTER — Ambulatory Visit (HOSPITAL_COMMUNITY): Payer: 59 | Attending: Cardiology | Admitting: Radiology

## 2011-02-03 ENCOUNTER — Other Ambulatory Visit (INDEPENDENT_AMBULATORY_CARE_PROVIDER_SITE_OTHER): Payer: 59 | Admitting: *Deleted

## 2011-02-03 DIAGNOSIS — I252 Old myocardial infarction: Secondary | ICD-10-CM | POA: Insufficient documentation

## 2011-02-03 DIAGNOSIS — R5381 Other malaise: Secondary | ICD-10-CM | POA: Insufficient documentation

## 2011-02-03 DIAGNOSIS — E785 Hyperlipidemia, unspecified: Secondary | ICD-10-CM | POA: Insufficient documentation

## 2011-02-03 DIAGNOSIS — R5383 Other fatigue: Secondary | ICD-10-CM | POA: Insufficient documentation

## 2011-02-03 DIAGNOSIS — Z8249 Family history of ischemic heart disease and other diseases of the circulatory system: Secondary | ICD-10-CM | POA: Insufficient documentation

## 2011-02-03 DIAGNOSIS — I2510 Atherosclerotic heart disease of native coronary artery without angina pectoris: Secondary | ICD-10-CM

## 2011-02-03 DIAGNOSIS — R9439 Abnormal result of other cardiovascular function study: Secondary | ICD-10-CM

## 2011-02-03 DIAGNOSIS — I10 Essential (primary) hypertension: Secondary | ICD-10-CM | POA: Insufficient documentation

## 2011-02-03 DIAGNOSIS — E7800 Pure hypercholesterolemia, unspecified: Secondary | ICD-10-CM

## 2011-02-03 LAB — HEPATIC FUNCTION PANEL
ALT: 31 U/L (ref 0–53)
AST: 28 U/L (ref 0–37)
Alkaline Phosphatase: 65 U/L (ref 39–117)
Bilirubin, Direct: 0.2 mg/dL (ref 0.0–0.3)
Total Bilirubin: 1 mg/dL (ref 0.3–1.2)

## 2011-02-03 LAB — LIPID PANEL
HDL: 44.8 mg/dL (ref >39.00)
Total CHOL/HDL Ratio: 4

## 2011-02-03 MED ORDER — TECHNETIUM TC 99M TETROFOSMIN IV KIT
10.0000 | PACK | Freq: Once | INTRAVENOUS | Status: AC | PRN
  Administered 2011-02-03: 10 via INTRAVENOUS

## 2011-02-03 MED ORDER — TECHNETIUM TC 99M TETROFOSMIN IV KIT
30.0000 | PACK | Freq: Once | INTRAVENOUS | Status: AC | PRN
  Administered 2011-02-03: 30 via INTRAVENOUS

## 2011-02-03 NOTE — Progress Notes (Signed)
[facility] SITE 3 NUCLEAR MED [address] [place] [address] [phone]  Cardiology Nuclear Med Study  [name] is a 51 y.o. male [id number] 1960/06/12   Nuclear Med Background Indication for Stress Test:  Evaluation for Ischemia and 01/20/11 Mildly Abnormal Treadmill, Assess Stent patency and Residual Disease History: 2009 Echo: EF:55%, 01/20/11 GXT: Inferolateral ST depression c/w ischemia, 2009 Heart Catheterization: LAD 100% 3V DZ EF 35-40%  Sent for stent to LAD, 2009 Myocardial Infarction:AWMI (STEMI) and 2009 Stents: LAD Cardiac Risk Factors: Family History - CAD, Hypertension and Lipids  Symptoms:  Fatigue   Nuclear Pre-Procedure Caffeine/Decaff Intake:  None NPO After: 7:30pm   Lungs:  clear IV 0.9% NS with Angio Cath:  20g  IV Site: R Wrist x 1, tolerated well  IV Started by:  Irean Hong, RN  Chest Size (in):  42 Cup Size: n/a  Height: 5\' 10"  (1.778 m)  Weight:  187 lb (84.823 kg)  BMI:  Body mass index is 26.83 kg/(m^2). Tech Comments:  Held coreg x 24 hrs    Nuclear Med Study 1 or 2 day study: 1 day  Stress Test Type:  Stress  Reading MD: Olga Millers, MD  Order Authorizing Provider:  Shawnie Pons, MD  Resting Radionuclide: Technetium 73m Tetrofosmin  Resting Radionuclide Dose: 11 mCi   Stress Radionuclide:  Technetium 45m Tetrofosmin  Stress Radionuclide Dose: 33 mCi           Stress Protocol Rest HR:77 Stress HR: 169  Rest BP: 125/85 Stress BP: 188/79  Exercise Time (min): 8:06 METS: 10.10   Predicted Max HR: 170 bpm % Max HR: 99.41 bpm Rate Pressure Product: 86578   Dose of Adenosine (mg):  n/a Dose of Lexiscan: n/a mg  Dose of Atropine (mg): n/a Dose of Dobutamine: n/a mcg/kg/min (at max HR)  Stress Test Technologist: Milana Na, EMT-P  Nuclear Technologist:  Domenic Polite, CNMT     Rest Procedure:  Myocardial perfusion imaging was performed at rest 45 minutes following the intravenous administration of  Technetium 68m Tetrofosmin. Rest ECG: NSR  Stress Procedure:  The patient exercised for 8:06.  The patient stopped due to fatigue and denied any chest pain.  There were non specific ST-T wave changes.  Technetium 21m Tetrofosmin was injected at peak exercise and myocardial perfusion imaging was performed after a brief delay. Stress ECG: No significant ST segment change suggestive of ischemia.  QPS Raw Data Images:  Acquisition technically good; normal left ventricular size. Stress Images:  There is decreased uptake in the distal anteroseptal wall. Rest Images:  There is decreased uptake in the anteroseptal wall. Subtraction (SDS):  No evidence of ischemia. Transient Ischemic Dilatation (Normal <1.22):  .36 Lung/Heart Ratio (Normal <0.45):  .94   Quantitative Gated Spect Images QGS EDV:  69 ml QGS ESV:  30 ml QGS cine images:  NL LV Function; NL Wall Motion QGS EF: 56%  Impression Exercise Capacity:  Good exercise capacity. BP Response:  Normal blood pressure response. Clinical Symptoms:  No chest pain. ECG Impression:  No significant ST segment change suggestive of ischemia. Comparison with Prior Nuclear Study: No images to compare  Overall Impression:  Abnormal stress nuclear study with a small, fixed defect in the distal anteroseptal wall consistent with small prior infarct; no ischemia.  Olga Millers

## 2011-02-15 ENCOUNTER — Other Ambulatory Visit: Payer: Self-pay | Admitting: *Deleted

## 2011-02-15 MED ORDER — ATORVASTATIN CALCIUM 80 MG PO TABS: 80.0000 mg | ORAL_TABLET | Freq: Every day | ORAL | Status: DC

## 2011-03-30 ENCOUNTER — Other Ambulatory Visit: Payer: Self-pay

## 2011-03-30 ENCOUNTER — Encounter (HOSPITAL_COMMUNITY)
Admission: EM | Disposition: A | Discharge status: Home / Self Care | Payer: Self-pay | Source: Other Acute Inpatient Hospital | Attending: Cardiovascular Disease

## 2011-03-30 ENCOUNTER — Inpatient Hospital Stay (HOSPITAL_COMMUNITY)
Admission: EM | Admit: 2011-03-30 | Discharge: 2011-04-01 | DRG: 247 | Disposition: A | Discharge status: Home / Self Care | Payer: 59 | Source: Other Acute Inpatient Hospital | Attending: Cardiovascular Disease | Admitting: Cardiovascular Disease

## 2011-03-30 ENCOUNTER — Encounter (HOSPITAL_COMMUNITY): Payer: Self-pay | Admitting: Unknown Physician Specialty

## 2011-03-30 ENCOUNTER — Other Ambulatory Visit: Payer: Self-pay | Admitting: Cardiovascular Disease

## 2011-03-30 DIAGNOSIS — F1010 Alcohol abuse, uncomplicated: Secondary | ICD-10-CM | POA: Diagnosis present

## 2011-03-30 DIAGNOSIS — I2510 Atherosclerotic heart disease of native coronary artery without angina pectoris: Secondary | ICD-10-CM

## 2011-03-30 DIAGNOSIS — R079 Chest pain, unspecified: Secondary | ICD-10-CM

## 2011-03-30 DIAGNOSIS — I252 Old myocardial infarction: Secondary | ICD-10-CM

## 2011-03-30 DIAGNOSIS — E7800 Pure hypercholesterolemia, unspecified: Secondary | ICD-10-CM | POA: Diagnosis present

## 2011-03-30 DIAGNOSIS — Z9861 Coronary angioplasty status: Secondary | ICD-10-CM

## 2011-03-30 DIAGNOSIS — I214 Non-ST elevation (NSTEMI) myocardial infarction: Principal | ICD-10-CM | POA: Diagnosis present

## 2011-03-30 DIAGNOSIS — D696 Thrombocytopenia, unspecified: Secondary | ICD-10-CM | POA: Diagnosis not present

## 2011-03-30 DIAGNOSIS — Z7982 Long term (current) use of aspirin: Secondary | ICD-10-CM

## 2011-03-30 DIAGNOSIS — Z79899 Other long term (current) drug therapy: Secondary | ICD-10-CM

## 2011-03-30 DIAGNOSIS — I10 Essential (primary) hypertension: Secondary | ICD-10-CM | POA: Diagnosis present

## 2011-03-30 HISTORY — DX: Alcohol use, unspecified, uncomplicated: F10.90

## 2011-03-30 HISTORY — PX: LEFT HEART CATHETERIZATION WITH CORONARY ANGIOGRAM: SHX5451

## 2011-03-30 HISTORY — DX: Other problems related to lifestyle: Z72.89

## 2011-03-30 LAB — CBC
HCT: 41.8 % (ref 39.0–52.0)
Hemoglobin: 14.6 g/dL (ref 13.0–17.0)
MCHC: 34.9 g/dL (ref 30.0–36.0)

## 2011-03-30 LAB — BASIC METABOLIC PANEL
BUN: 10 mg/dL (ref 6–23)
Chloride: 99 mEq/L (ref 96–112)
GFR calc Af Amer: >90 mL/min (ref >90)
Glucose, Bld: 107 mg/dL — ABNORMAL HIGH (ref 70–99)
Potassium: 3.8 mEq/L (ref 3.5–5.1)

## 2011-03-30 LAB — MRSA PCR SCREENING: MRSA by PCR: NEGATIVE

## 2011-03-30 LAB — CARDIAC PANEL(CRET KIN+CKTOT+MB+TROPI)
Relative Index: 9.1 — ABNORMAL HIGH (ref 0.0–2.5)
Total CK: 267 U/L — ABNORMAL HIGH (ref 7–232)
Troponin I: 3.3 ng/mL (ref <0.30)
Troponin I: 7.94 ng/mL (ref <0.30)

## 2011-03-30 SURGERY — LEFT HEART CATHETERIZATION WITH CORONARY ANGIOGRAM
Anesthesia: LOCAL

## 2011-03-30 MED ORDER — NITROGLYCERIN IN D5W 200-5 MCG/ML-% IV SOLN: 3.0000 ug/min | INTRAVENOUS | Status: DC

## 2011-03-30 MED ORDER — ASPIRIN 81 MG PO CHEW
81.0000 mg | CHEWABLE_TABLET | Freq: Every day | ORAL | Status: DC
  Administered 2011-03-31 – 2011-04-01 (×2): 81 mg via ORAL
  Filled 2011-03-30 (×2): qty 1

## 2011-03-30 MED ORDER — ACETAMINOPHEN 325 MG PO TABS
650.0000 mg | ORAL_TABLET | ORAL | Status: DC | PRN
  Administered 2011-03-30: 650 mg via ORAL

## 2011-03-30 MED ORDER — ACETAMINOPHEN 325 MG PO TABS
ORAL_TABLET | ORAL | Status: AC
  Filled 2011-03-30: qty 2

## 2011-03-30 MED ORDER — LIDOCAINE HCL (PF) 1 % IJ SOLN
INTRAMUSCULAR | Status: AC
  Filled 2011-03-30: qty 30

## 2011-03-30 MED ORDER — CARVEDILOL 6.25 MG PO TABS
6.2500 mg | ORAL_TABLET | Freq: Two times a day (BID) | ORAL | Status: DC
  Administered 2011-03-31 – 2011-04-01 (×3): 6.25 mg via ORAL
  Filled 2011-03-30 (×8): qty 1

## 2011-03-30 MED ORDER — NITROGLYCERIN 0.4 MG SL SUBL: 0.4000 mg | SUBLINGUAL_TABLET | SUBLINGUAL | Status: DC | PRN

## 2011-03-30 MED ORDER — TICAGRELOR 90 MG PO TABS
90.0000 mg | ORAL_TABLET | Freq: Two times a day (BID) | ORAL | Status: DC
  Administered 2011-03-30 – 2011-04-01 (×4): 90 mg via ORAL
  Filled 2011-03-30 (×5): qty 1

## 2011-03-30 MED ORDER — ZOLPIDEM TARTRATE 5 MG PO TABS: 5.0000 mg | ORAL_TABLET | Freq: Every evening | ORAL | Status: DC | PRN

## 2011-03-30 MED ORDER — TICAGRELOR 90 MG PO TABS
ORAL_TABLET | ORAL | Status: AC
  Administered 2011-03-31: 90 mg via ORAL
  Filled 2011-03-30: qty 2

## 2011-03-30 MED ORDER — ATORVASTATIN CALCIUM 80 MG PO TABS
80.0000 mg | ORAL_TABLET | Freq: Every day | ORAL | Status: DC
  Administered 2011-03-30 – 2011-03-31 (×2): 80 mg via ORAL
  Filled 2011-03-30 (×6): qty 1

## 2011-03-30 MED ORDER — ONDANSETRON HCL 4 MG/2ML IJ SOLN: 4.0000 mg | Freq: Four times a day (QID) | INTRAMUSCULAR | Status: DC | PRN

## 2011-03-30 MED ORDER — MIDAZOLAM HCL 2 MG/2ML IJ SOLN
INTRAMUSCULAR | Status: AC
  Filled 2011-03-30: qty 2

## 2011-03-30 MED ORDER — FAMOTIDINE 20 MG PO TABS
20.0000 mg | ORAL_TABLET | Freq: Every day | ORAL | Status: DC
  Administered 2011-03-31 – 2011-04-01 (×2): 20 mg via ORAL
  Filled 2011-03-30 (×3): qty 1

## 2011-03-30 MED ORDER — SODIUM CHLORIDE 0.9 % IV SOLN
INTRAVENOUS | Status: AC
Start: 2011-03-30 — End: 2011-03-30

## 2011-03-30 MED ORDER — HEPARIN SODIUM (PORCINE) 1000 UNIT/ML IJ SOLN
INTRAMUSCULAR | Status: AC
  Filled 2011-03-30: qty 1

## 2011-03-30 MED ORDER — NITROGLYCERIN 0.2 MG/ML ON CALL CATH LAB
INTRAVENOUS | Status: AC
  Filled 2011-03-30: qty 1

## 2011-03-30 MED ORDER — BIVALIRUDIN 250 MG IV SOLR
INTRAVENOUS | Status: AC
  Filled 2011-03-30: qty 250

## 2011-03-30 MED ORDER — HEPARIN (PORCINE) IN NACL 2-0.9 UNIT/ML-% IJ SOLN
INTRAMUSCULAR | Status: AC
  Filled 2011-03-30: qty 2000

## 2011-03-30 MED ORDER — VERAPAMIL HCL 2.5 MG/ML IV SOLN
INTRAVENOUS | Status: AC
  Filled 2011-03-30: qty 2

## 2011-03-30 MED ORDER — ALPRAZOLAM 0.25 MG PO TABS: 0.2500 mg | ORAL_TABLET | Freq: Two times a day (BID) | ORAL | Status: DC | PRN

## 2011-03-30 MED ORDER — FENTANYL CITRATE 0.05 MG/ML IJ SOLN
INTRAMUSCULAR | Status: AC
  Filled 2011-03-30: qty 2

## 2011-03-30 NOTE — Progress Notes (Signed)
CRITICAL VALUE ALERT  Critical value received: CKMB 18.6 Troponin I  Date of notification:  03/30/11  Time of notification: 1920  Critical value read back:yes  Nurse who received alert:  Victorino December RN  MD notified (1st page):  Ronie Spies PA  Time of first page:  1922  MD notified (2nd page):  Time of second page:  Responding MD:  Ronie Spies PA  Time MD responded:  [phone]

## 2011-03-30 NOTE — Interval H&P Note (Signed)
History and Physical Interval Note:  03/30/2011 3:36 PM  [name]  has presented today for cardiac cath with the diagnosis of chest pain  The various methods of treatment have been discussed with the patient and family. After consideration of risks, benefits and other options for treatment, the patient has consented to  Procedure(s) (LRB): LEFT HEART CATHETERIZATION WITH CORONARY ANGIOGRAM (N/A) as a surgical intervention .  The patients' history has been reviewed, patient examined, no change in status, stable for surgery.  I have reviewed the patients' chart and labs.  Questions were answered to the patient's satisfaction.     [name]

## 2011-03-30 NOTE — CV Procedure (Signed)
Cardiac Catheterization Operative Report  [name] [id number] 2/27/20134:55 PM Aura Dials, MD, MD  Procedure Performed:  1. Left Heart Catheterization 2. Selective Coronary Angiography 3. Left ventricular angiogram 4. PTCA/DES x 2 first Obtuse Marginal  Operator: Verne Carrow, MD  Arterial access site:  Right radial artery.   Indication: Unstable angina                                    Procedure Details: The risks, benefits, complications, treatment options, and expected outcomes were discussed with the patient. The patient and/or family concurred with the proposed plan, giving informed consent. The patient was brought to the cath lab after IV hydration was begun and oral premedication was given. The patient was further sedated with Versed and Fentanyl. The right wrist was assessed with an Allens test which was positive. The right wrist was prepped and draped in a sterile fashion. 1% lidocaine was used for local anesthesia. Using the modified Seldinger access technique, a 5 French sheath was placed in the right radial artery. 3 mg Verapamil was given through the sheath. 4000 units IV heparin was given. Standard diagnostic catheters were used to perform selective coronary angiography. The JR 4 catheter was selective in the conus branch. I then used a No-Torque right catheter to engage the RCA. The JL 3.5 catheter would not reach the left main. A EBU 3.5 guiding catheter was used to engage the left main artery. The patient was found to have severe disease in the mid Circumflex artery extending into the first OM branch. The OM branch was diffusely diseased. The patient was given a bolus of Angiomax and a drip was started. He was then given 180 mg po Brilinta x 1. When the ACT was >200, I passed a Cougar IC wire down the Circumflex into the OM branch. A 2.0 x 15 mm balloon was inflated three times in the mid vessel extending into the OM branch. A 2.25 x 32 mm Promus Element DES  was deployed in the mid Circumflex extending into the OM branch. This was post-dilated with a 2.25 x 20 mm Boyle balloon x 2. Angiography showed residual disease beyond the stent. It was hard to gauge how much of this disease would need to be treated. The segment off of the distal stent edge appeared to have 70-80% stenosis. I elected to place a stent in this area. A 2.25 x 16 mm Promus Element DES was deployed on the distal edge of the previously placed stent. This was post-dilated with a 2.25 x 20 mm [place] balloon x 1. The stenosis in the mid Circumflex and OM branch was taken from 99% down to 0%. There was excellent flow into the distal vessel. The guiding catheter was removed over a wire.  A pigtail catheter was used to perform a left ventricular angiogram.   The sheath was removed from the right radial artery and a Terumo hemostasis band was applied at the arteriotomy site on the right wrist.  There were no immediate complications. The patient was taken to the recovery area in stable condition.   Hemodynamic Findings: Central aortic pressure: 116/79 Left ventricular pressure: 116/12/19  Angiographic Findings:  Left main:  No evidence of disease.   Left Anterior Descending Artery: Large vessel that courses to the apex. The stent in the mid vessel is patent with minimal in-stent restenosis. Moderate sized diagonal branch with 50% proximal stenosis.  Circumflex Artery: Early intermediate branch with 40% proximal stenosis. The mid vessel has a 99% diffuse stenosis extending down into the first obtuse marginal branch. This is a small to moderate sized vessel. The AV groove Circumflex is very small in caliber beyond the takeoff of the OM branch.   Right Coronary Artery: Large dominant vessel with mild 205 plaque in the mid vessel.   Left Ventricular Angiogram: LVEF 60-65%. NO wall motion abormalities. No MR noted.   Impression: 1. Double vessel CAD 2. NSTEMI secondary to severe stenosis mid  Circumflex/OM1. 3. Patent stent mid LAD 4. Successful PTCA/DES x2 (overlapping stents) mid Circumflex/OM1. 5. Preserved LV systolic function.   Recommendations: ASABrilinta for at least one year.        Complications:  None. The patient tolerated the procedure well.

## 2011-03-30 NOTE — Progress Notes (Signed)
Contacted Dr. Charm Barges regarding pt elevated troponin of 7.94 and CK-MB of 24.3.  Pts troponin and CK-MB were elevated before this level as well (see previous critical value progress note).  Pt is relaxing and currently CP free.  No orders were received.  Will continue to assess.

## 2011-03-30 NOTE — H&P (Signed)
Patient ID: [name] MRN: [id number], DOB/AGE: 51-29-1962   Admit date: 03/30/2011   Primary Physician: Aura Dials, MD, MD Primary Cardiologist: T. Riley Kill, MD   Pt. Profile:  51 y/o male with prior h/o CAD s/p ant mi in 2009 who presents with recurrent chest pain.  Problem List  Past Medical History  Diagnosis Date  . Coronary artery disease     a. 04/2007 - Ant Stemi: LM: nl, LAD: 100p ( 2.75 x 28 Vision BMS ), LCX: 70-75, RI: 40/50, RCA: 91m, EF 35-40%.;  b. 06/2007 - NL ETT;  c.  08/2007 Echo - EF 55%, Ap-Inf & m-d Sept AK;  d. 01/2011 - Abnl ETT (Inf-Lat ST dep);  e.  02/2011 - Ex MV:  Dist ant-sept Infarct.  No ischemia.  . Hypertension   . Hypercholesteremia   . Habitual alcohol use     a. at least a 12 pack/wk (03/30/2011)    Past Surgical History  Procedure Date  . Coronary stent placement     of the left anterior descending artery     Allergies  No Known Allergies  HPI  51 year old male with the above problem list who was in his usoh until yesterday, when after eating lunch, he developed bilateral arm/tricep discomfort w/o associated Ss.  Discomfort lasted ~ 30 mins and resolved spontaneously.  The remainder of the day/evening were uneventful.  This AM, he awoke between 5 & 5:30 AM with 4-5/10 sscp/pressure with radiation to bilat upper arms assoc with wkns and generalized malaise.  He took a BC powder and Ss eased up enough for him to fall back to sleep but when he awoke later in the AM, he had recurrent Ss and took ii SL NTG with complete relief.  He then went to work and after a few hrs there, had recurrent c/p and his boss took him to the [facility] ED.  There, he was noted to have 0.5 - 1mm inferior ST elevation.  He was treated with heparin, IV NTG, and MSO4, with nearly complete relief of chest pain.  He was then transferred to the [facility] CCU for direct admission and further cardiac eval.  Currently pain is 1-2/10.  He is otw stable.  CE from [place]  appear to have been drawn but we do not have those results.  Home Medications  Prior to Admission medications   Medication Sig Start Date End Date Taking? Authorizing Provider  aspirin 81 MG chewable tablet Chew 81 mg by mouth daily.     Yes Historical Provider, MD  atorvastatin (LIPITOR) 80 MG tablet Take 80 mg by mouth at bedtime. 02/15/11 02/15/12 Yes Shawnie Pons, MD  carvedilol (COREG) 6.25 MG tablet Take 1 tablet (6.25 mg total) by mouth 2 (two) times daily. 12/07/10 12/07/11 Yes Shawnie Pons, MD  nitroGLYCERIN (NITROSTAT) 0.4 MG SL tablet Place 1 tablet (0.4 mg total) under the tongue every 5 (five) minutes as needed for chest pain. 08/09/10 08/09/11 Yes Shawnie Pons, MD  ranitidine (ZANTAC) 150 MG capsule Take 150 mg by mouth every morning.    Yes Historical Provider, MD    Family History  Family History  Problem Relation Age of Onset  . Coronary artery disease Mother 58    alive @ 66  . Coronary artery disease Father     had MI in his 60's - died @ 47  . Heart attack Brother 51    alive - 74    Social History  History  Social History  . Marital Status: Single    Spouse Name: N/A    Number of Children: N/A  . Years of Education: N/A   Occupational History  . 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 Smoking status: Never Smoker   . Smokeless tobacco: Never Used  . Alcohol Use: Yes     drinks at least a 12 pack/beer per week  . Drug Use: No  . Sexually Active: Not on file   Other Topics Concern  . Not on file   Social History Narrative   Lives in [place] by himself.  Works locally as a Stage manager.     Review of Systems General:  No chills, fever, night sweats or weight changes.  Cardiovascular:  +++ chest pain.  No dyspnea on exertion, edema, orthopnea, palpitations, paroxysmal nocturnal dyspnea. Dermatological: No rash, lesions/masses Respiratory: No cough, dyspnea Urologic: No hematuria, dysuria Abdominal:   No nausea,  vomiting, diarrhea, bright red blood per rectum, melena, or hematemesis Neurologic:  No visual changes, wkns, changes in mental status. All other systems reviewed and are otherwise negative except as noted above.  Physical Exam  Blood pressure 121/80, pulse 85, temperature 97.9 F (36.6 C), temperature source Oral, resp. rate 12, height 5\' 10"  (1.778 m), weight 187 lb (84.823 kg), SpO2 99.00%.  General: Pleasant, NAD Psych: Normal affect. Neuro: Alert and oriented X 3. Moves all extremities spontaneously. HEENT: Normal  Neck: Supple without bruits or JVD. Lungs:  Resp regular and unlabored, CTA. Heart: RRR no s3, s4, or murmurs. Abdomen: Soft, non-tender, non-distended, BS + x 4.  Extremities: No clubbing, cyanosis or edema. DP/PT/Radials 2+ and equal bilaterally.  Labs  pending   Radiology/Studies  pending  ECG  RSR, 78, ~ 1mm inferior st elevation - new since 12/2010 ECG - improved on f/u ECG here.  ASSESSMENT AND PLAN  1.  Acute Coronary Syndrome/CAD:  Pt presents from [facility] ED with Ss concerning for ACS.  He has been loaded with ASA and is currently on Heparin and IV NTG.  He cont to have mild chest pain.  CE were drawn in [place], though we do not yet have those results.  ECG here shows less inf ST elevation.  We will plan for cath this afternoon.  Pt is NPO.  Cont asa, bb, high dose statin.  Plan to add p2y12 inhibitor pending cath results.  2.  HTN:  Stable.  3.  HL:  Recently had lipitor increased.  Will repeat lipids/lfts.   Signed, Nicolasa Ducking, NP 03/30/2011, 3:06 PM  Patient seen, examined. Available data reviewed. Agree with findings, assessment, and plan as outlined by Ward Givens, NP. This patient has classic symptoms of ACS. EKG is abnormal but not diagnostic of STEMI. He continues to have stuttering symptoms. Recommend urgent cath and possible PCI. Risks, indication, and alternatives discussed with patient and he agrees to  proceed.  Tonny Bollman, M.D. 03/30/2011 3:12 PM

## 2011-03-31 ENCOUNTER — Other Ambulatory Visit: Payer: Self-pay

## 2011-03-31 ENCOUNTER — Encounter (HOSPITAL_COMMUNITY): Payer: Self-pay | Admitting: *Deleted

## 2011-03-31 LAB — BASIC METABOLIC PANEL
CO2: 24 mEq/L (ref 19–32)
Calcium: 9.7 mg/dL (ref 8.4–10.5)
Glucose, Bld: 76 mg/dL (ref 70–99)
Sodium: 140 mEq/L (ref 135–145)

## 2011-03-31 LAB — CBC
Hemoglobin: 15 g/dL (ref 13.0–17.0)
MCH: 31.6 pg (ref 26.0–34.0)
RBC: 4.74 MIL/uL (ref 4.22–5.81)

## 2011-03-31 LAB — LIPID PANEL
Cholesterol: 173 mg/dL (ref 0–200)
LDL Cholesterol: 81 mg/dL (ref 0–99)
VLDL: 38 mg/dL (ref 0–40)

## 2011-03-31 LAB — HEPATIC FUNCTION PANEL
Albumin: 3.8 g/dL (ref 3.5–5.2)
Alkaline Phosphatase: 74 U/L (ref 39–117)
Total Bilirubin: 0.9 mg/dL (ref 0.3–1.2)

## 2011-03-31 LAB — CARDIAC PANEL(CRET KIN+CKTOT+MB+TROPI): Troponin I: 8.89 ng/mL (ref <0.30)

## 2011-03-31 MED ORDER — HEART ATTACK BOUNCING BOOK
Freq: Once | Status: AC
  Administered 2011-04-01: 12:00:00
  Filled 2011-03-31 (×2): qty 1

## 2011-03-31 MED FILL — Dextrose Inj 5%: INTRAVENOUS | Qty: 50 | Status: AC

## 2011-03-31 NOTE — Progress Notes (Signed)
TR BAND REMOVAL  LOCATION:    right radial  DEFLATED PER PROTOCOL:    yes  TIME BAND OFF / DRESSING APPLIED:    2300   SITE UPON ARRIVAL:    Level 0  SITE AFTER BAND REMOVAL:    Level 0  REVERSE ALLEN'S TEST:     positive  CIRCULATION SENSATION AND MOVEMENT:    Within Normal Limits   yes  COMMENTS:   stable

## 2011-03-31 NOTE — Progress Notes (Signed)
Subjective:  Doing well.  No chest pain.  Recent GXT was negative for ischemia.  Presented with ACS while on high dose atorvastatin recently increased.  Has not done as well recently with his diet or his exercise.    Objective:  Vital Signs in the last 24 hours: Temp:  [97.9 F (36.6 C)-99.7 F (37.6 C)] 98.1 F (36.7 C) (02/28 0818) Pulse Rate:  [75-98] 84  (02/28 0818) Resp:  [10-21] 18  (02/28 0818) BP: (101-129)/(64-93) 129/89 mmHg (02/28 0818) SpO2:  [95 %-100 %] 97 % (02/28 0818) Weight:  [186 lb 4.6 oz (84.5 kg)-187 lb (84.823 kg)] 186 lb 4.6 oz (84.5 kg) (02/28 0620)  Intake/Output from previous day: 02/27 0701 - 02/28 0700 In: 643.8 [P.O.:240; I.V.:403.8] Out: 500 [Urine:500]   Physical Exam: General: Well developed, well nourished, in no acute distress. Head:  Normocephalic and atraumatic. Lungs: Clear to auscultation and percussion. Heart: Normal S1 and S2.  No murmur, rubs or gallops.  Pulses:  Right radial site looks good.   Extremities: No clubbing or cyanosis. No edema. Neurologic: Alert and oriented x 3.    Lab Results:  Basename 03/31/11 0408 03/30/11 2057  WBC 7.6 8.3  HGB 15.0 14.6  PLT 136* 148*    Basename 03/31/11 0408 03/30/11 2057  NA 140 135  K 3.7 3.8  CL 105 99  CO2 24 25  GLUCOSE 76 107*  BUN 11 10  CREATININE 0.83 0.81    Basename 03/31/11 0321 03/30/11 2057  TROPONINI 8.89* 7.94*   Hepatic Function Panel  Basename 03/31/11 0408  PROT 6.8  ALBUMIN 3.8  AST 54*  ALT 26  ALKPHOS 74  BILITOT 0.9  BILIDIR 0.1  IBILI 0.8    Basename 03/31/11 0408  CHOL 173   No results found for this basename: PROTIME in the last 72 hours  Imaging: No results found.  EKG:  No acute changes  Cardiac Studies:  See cath report.    Assessment/Plan:  Patient Active Hospital Problem List: NSTEMI (non-ST elevated myocardial infarction) (03/30/2011)   Assessment: Doing well.  No recurrent pain sp PCI of cfx   Plan: ambulate today and  discharge tomorrow. Hyperlipidemia   Continue current meds.        Shawnie Pons, MD, [facility], FSCAI 03/31/2011, 8:58 AM

## 2011-03-31 NOTE — Progress Notes (Signed)
CARDIAC REHAB PHASE I   PRE:  Rate/Rhythm: 86 SR    BP: sitting 129/89    SaO2:   MODE:  Ambulation: 680 ft   POST:  Rate/Rhythm: 102 ST    BP: sitting 117/94     SaO2:   Tolerated well. Ed completed. Encourage better eating habits, decrease stress and more ex. Agrees to CRPII [place]. [id number]  Harriet Masson CES, ACSM

## 2011-03-31 NOTE — Progress Notes (Signed)
   CARE MANAGEMENT NOTE 03/31/2011  Patient:  Stopher,Tyrek A   Account Number:  [id number]  Date Initiated:  03/31/2011  Documentation initiated by:  Sharrie Rothman  Subjective/Objective Assessment:   Pt admitted with CP, S/P cath. Pt to be discharged with Brilinta.     Action/Plan:   Spoke with pt about Brilinta. Benefits check also done for copayment amt. Talked with pharmacy [facility] to verify drug availability. MD to write script for 30 day free supply, no refills, and script for refills.   Anticipated DC Date:  04/01/2011   Anticipated DC Plan:  HOME/SELF CARE      DC Planning Services  CM consult      Choice offered to / List presented to:             Status of service:  Completed, signed off Medicare Important Message given?   (If response is "NO", the following Medicare IM given date fields will be blank) Date Medicare IM given:   Date Additional Medicare IM given:    Discharge Disposition:  HOME/SELF CARE  Per UR Regulation:    Comments:  03-31-11 1037 Arlyss Queen, RN BSN Care Management Spoke with pt about drug and pharmacy of choice. 30 day free card given to pt. Benefits check done. Will inform pt of copayment amount. [facility] on [address] called and checked availability of drug. No other needs noted at this time.

## 2011-03-31 NOTE — Progress Notes (Signed)
UR Completed. Simmons, Yisell Sprunger F [phone]  

## 2011-04-01 ENCOUNTER — Other Ambulatory Visit: Payer: Self-pay

## 2011-04-01 ENCOUNTER — Encounter (HOSPITAL_COMMUNITY): Payer: Self-pay | Admitting: Nurse Practitioner

## 2011-04-01 DIAGNOSIS — I214 Non-ST elevation (NSTEMI) myocardial infarction: Secondary | ICD-10-CM

## 2011-04-01 LAB — CARDIAC PANEL(CRET KIN+CKTOT+MB+TROPI): Relative Index: 4.6 — ABNORMAL HIGH (ref 0.0–2.5)

## 2011-04-01 LAB — CBC
HCT: 45.1 % (ref 39.0–52.0)
Hemoglobin: 15.7 g/dL (ref 13.0–17.0)
MCHC: 34.8 g/dL (ref 30.0–36.0)
RBC: 4.9 MIL/uL (ref 4.22–5.81)
WBC: 6 10*3/uL (ref 4.0–10.5)

## 2011-04-01 MED ORDER — TICAGRELOR 90 MG PO TABS: 90.0000 mg | ORAL_TABLET | Freq: Two times a day (BID) | ORAL | Status: DC

## 2011-04-01 MED FILL — Heparin Sodium (Porcine) 100 Unt/ML in Sodium Chloride 0.45%: INTRAMUSCULAR | Qty: 500 | Status: AC

## 2011-04-01 MED FILL — Morphine Sulfate Inj 10 MG/ML: INTRAMUSCULAR | Qty: 1 | Status: AC

## 2011-04-01 NOTE — Discharge Summary (Signed)
Patient ID: [name],  MRN: [id number], DOB/AGE: [birth year] 51 y.o.  Admit date: 03/30/2011 Discharge date: 04/01/2011  Primary Care Provider: Aura Dials, MD Primary Cardiologist: T. Riley Kill, MD  Discharge Diagnoses Principal Problem:  *NSTEMI (non-ST elevated myocardial infarction) Active Problems:  HYPERCHOLESTEROLEMIA  HYPERTENSION  Coronary artery disease   Allergies No Known Allergies  Procedures  03/30/2011 Cardiac Catheterization and Percutaneous Coronary Intervention  Hemodynamic Findings: Central aortic pressure: 116/79 Left ventricular pressure: 116/12/19  Angiographic Findings:  Left main:  No evidence of disease.   Left Anterior Descending Artery: Large vessel that courses to the apex. The stent in the mid vessel is patent with minimal in-stent restenosis. Moderate sized diagonal branch with 50% proximal stenosis.    Circumflex Artery: Early intermediate branch with 40% proximal stenosis. The mid vessel has a 99% diffuse stenosis extending down into the first obtuse marginal branch. This is a small to moderate sized vessel. The AV groove Circumflex is very small in caliber beyond the takeoff of the OM branch.     ** The LCX was stented into the OM1 with a 2.25 x 32 mm Promus Element DES in the mid  vessel and a 2.25 x 16 mm Promus Element DES more distally.  Right Coronary Artery: Large dominant vessel with mild 205 plaque in the mid vessel.   Left Ventricular Angiogram: LVEF 60-65%. NO wall motion abormalities. No MR noted.  ________________________________  History of Present Illness  51 y/o male with the above problem list.  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 Discomfort lasted ~ 30 mins and resolved spontaneously.  On the morning of admission, he awoke between 5 & 5:30 AM with 4-5/10 sscp/pressure with radiation to bilat upper arms assoc with wkns  and generalized malaise.  Ss initially resolved with BC powder and subsequently SL NTG, but recurred while @ work, prompting him to present to the [place] ED.  There, he had minimal - o.5 - 1mm - inferior ST elevation.  He was treated with heparin, IV NTG, and MSO4, with nearly complete relief of chest pain.  He was transferred to [facility] for further eval.  Hospital Course  Upon arrival to [facility], pts ecg had normalized.  He cont to complain of 2/10 chest pain and decision was made to pursue diagnostic cath.  This showed severe LCX dzs extending into the OM1 and otherwise nonobstructive disease and normal LV function.  The LCX was felt to be the infarct vessel, and was successfully stented using two Promus Element DES's.  Pt tolerated procedure well and post-procedure has had no further chest pain.  He did rule in for MI, eventually peaking his CK @ 334, MB @ 29.4, and Ti @ 8.89.  He has been seen by cardiac rehab and has been ambulating without recurrent chest pain or limitations.  He will be d/c'd home today in good condition.  Discharge Vitals Blood pressure 121/78, pulse 78, temperature 97.5 F (36.4 C), temperature source Oral, resp. rate 18, height 5\' 10"  (1.778 m), weight 187 lb 13.3 oz (85.2 kg), SpO2 97.00%.  Filed Weights   03/30/11 1605 03/31/11 0620 04/01/11 0600  Weight: 187 lb (84.823 kg) 186 lb 4.6 oz (84.5 kg) 187 lb 13.3 oz (85.2 kg)    Labs  CBC  Basename 04/01/11 0923 03/31/11 0408  WBC 6.0 7.6  NEUTROABS -- --  HGB 15.7 15.0  HCT 45.1 43.1  MCV  92.0 90.9  PLT 145* 136*   Basic Metabolic Panel  Basename 03/31/11 0408 03/30/11 2057  NA 140 135  K 3.7 3.8  CL 105 99  CO2 24 25  GLUCOSE 76 107*  BUN 11 10  CREATININE 0.83 0.81  CALCIUM 9.7 9.8  MG -- --  PHOS -- --   Liver Function Tests  Basename 03/31/11 0408  AST 54*  ALT 26  ALKPHOS 74  BILITOT 0.9  PROT 6.8  ALBUMIN 3.8    Cardiac Enzymes  Basename 04/01/11 0921 03/31/11 0321 03/30/11 2057    CKTOTAL 108 334* 267*  CKMB 5.0* 29.4* 24.3*  CKMBINDEX -- -- --  TROPONINI 2.75* 8.89* 7.94*   Fasting Lipid Panel  Basename 03/31/11 0408  CHOL 173  HDL 54  LDLCALC 81  TRIG 191*  CHOLHDL 3.2  LDLDIRECT --   Disposition  Pt is being discharged home today in good condition.  Follow-up Plans & Appointments  Follow-up Information    Follow up with Tereso Newcomer, PA on 04/07/2011. (9:45 AM)    Contact information:   [address] [place] [address] [phone]       Follow up with Aura Dials, MD. (as scheduled)    Contact information:   [address] [place] [address] [phone]         Discharge Medications  Medication List  As of 04/01/2011 11:05 AM   TAKE these medications         aspirin 81 MG chewable tablet   Chew 81 mg by mouth daily.      atorvastatin 80 MG tablet   Commonly known as: LIPITOR   Take 80 mg by mouth at bedtime.      carvedilol 6.25 MG tablet   Commonly known as: COREG   Take 1 tablet (6.25 mg total) by mouth 2 (two) times daily.      nitroGLYCERIN 0.4 MG SL tablet   Commonly known as: NITROSTAT   Place 1 tablet (0.4 mg total) under the tongue every 5 (five) minutes as needed for chest pain.      ranitidine 150 MG capsule   Commonly known as: ZANTAC   Take 150 mg by mouth every morning.      Ticagrelor 90 MG Tabs tablet   Commonly known as: BRILINTA   Take 1 tablet (90 mg total) by mouth 2 (two) times daily.            Outstanding Labs/Studies  None  Duration of Discharge Encounter   Greater than 30 minutes including physician time.  Signed, Nicolasa Ducking NP 04/01/2011, 11:05 AM

## 2011-04-01 NOTE — Progress Notes (Signed)
   CARE MANAGEMENT NOTE 04/01/2011  Patient:  [name],[name]   Account Number:  [id number]  Date Initiated:  03/31/2011  Documentation initiated by:  Sharrie Rothman  Subjective/Objective Assessment:   Pt admitted with CP, S/P cath. Pt to be discharged with Brilinta.     Action/Plan:   Spoke with pt about Brilinta. Benefits check also done for copayment amt. Talked with pharmacy [facility] to verify drug availability. MD to write script for 30 day free supply, no refills, and script for refills.   Anticipated DC Date:  04/01/2011   Anticipated DC Plan:  HOME/SELF CARE      DC Planning Services  CM consult      Choice offered to / List presented to:             Status of service:  Completed, signed off Medicare Important Message given?   (If response is "NO", the following Medicare IM given date fields will be blank) Date Medicare IM given:   Date Additional Medicare IM given:    Discharge Disposition:  HOME/SELF CARE  Per UR Regulation:    Comments:  04-01-11 [address] Tomi Bamberger, RN,BSN [phone] CM did [facility] [address] to verify if they still have brilinta bottle available and they do. Pt will need rx for 30day free no refills and the original rx with refills.  03-31-11 [address] Tomi Bamberger, [place] [phone] CM will make pt aware that co pay cost is 85.00.  03-31-11 1037 Arlyss Queen, RN BSN Care Management Spoke with pt about drug and pharmacy of choice. 30 day free card given to pt. Benefits check done. Will inform pt of copayment amount. [facility] [address] [facility] and checked availability of drug. No other needs noted at this time.

## 2011-04-01 NOTE — Discharge Instructions (Signed)
***  PLEASE REMEMBER TO BRING ALL OF YOUR MEDICATIONS TO EACH OF YOUR FOLLOW-UP OFFICE VISITS.   NO HEAVY LIFTING X 2 WEEKS. NO SEXUAL ACTIVITY X 2 WEEKS. NO DRIVING X 2 WEEKS. NO SOAKING BATHS, HOT TUBS, POOLS, ETC., X 7 DAYS.

## 2011-04-01 NOTE — Progress Notes (Signed)
Subjective:  He is doing well.  No chest pain.  Anxious to go home today.    Objective:  Vital Signs in the last 24 hours: Temp:  [97.5 F (36.4 C)-99.8 F (37.7 C)] 97.5 F (36.4 C) (03/01 0745) Pulse Rate:  [78-92] 78  (03/01 0745) Resp:  [18] 18  (03/01 0745) BP: (116-133)/(76-102) 121/78 mmHg (03/01 0745) SpO2:  [97 %-99 %] 97 % (03/01 0745) Weight:  [187 lb 13.3 oz (85.2 kg)] 187 lb 13.3 oz (85.2 kg) (03/01 0600)  Intake/Output from previous day: 02/28 0701 - 03/01 0700 In: 1680 [P.O.:1680] Out: -    Physical Exam: General: Well developed, well nourished, in no acute distress. Head:  Normocephalic and atraumatic. Lungs: Clear to auscultation and percussion. Heart: Normal S1 and S2.  No murmur, rubs or gallops.  Extremities: No clubbing or cyanosis. No edema. Neurologic: Alert and oriented x 3.    Lab Results:  Basename 03/31/11 0408 03/30/11 2057  WBC 7.6 8.3  HGB 15.0 14.6  PLT 136* 148*    Basename 03/31/11 0408 03/30/11 2057  NA 140 135  K 3.7 3.8  CL 105 99  CO2 24 25  GLUCOSE 76 107*  BUN 11 10  CREATININE 0.83 0.81    Basename 03/31/11 0321 03/30/11 2057  TROPONINI 8.89* 7.94*   Hepatic Function Panel  Basename 03/31/11 0408  PROT 6.8  ALBUMIN 3.8  AST 54*  ALT 26  ALKPHOS 74  BILITOT 0.9  BILIDIR 0.1  IBILI 0.8    Basename 03/31/11 0408  CHOL 173   No results found for this basename: PROTIME in the last 72 hours  Imaging: No results found.  EKG:  Reviewed in detail.  NSR.  No acute changes.      Assessment/Plan:  Patient Active Hospital Problem List: NSTEMI (non-ST elevated myocardial infarction) (03/30/2011)   Assessment: stable.  No further symptoms   Plan: recheck cardiac panel and ambulate.  Probably home later if stable.  Work discussed.  Follow up in office in ten days.  He cannot drive for two weeks.  After that he will be able to return to work.  He does not think he can afford ticagrelor for month than one month, but  we will discuss with clopidogrel switch.    Hyperlipidemia   He is on higher dose of meds. Thrombocytopenia   Very minimal   Recheck with CBC and monitor      Shawnie Pons, MD, [facility], [facility] 04/01/2011, 8:46 AM

## 2011-04-01 NOTE — Progress Notes (Signed)
CARDIAC REHAB PHASE I   PRE:  Rate/Rhythm: 87 SR    BP: sitting 125/77    SaO2:   MODE:  Ambulation: 1020 ft   POST:  Rate/Rhythm: 108 ST    BP: sitting 128/87     SaO2:   Tolerated well independently with brisk pace. No sx, feels well. Reviewed NTG. Will sign off.  [id number]  Harriet Masson CES, ACSM

## 2011-04-02 NOTE — Discharge Summary (Signed)
Patient was seen on rounds.  I instructed him not to drive for at least two weeks, and told him he could return to the office after that time.    He will see me in follow up.    Shawnie Pons 10:55 AM 04/02/2011

## 2011-04-07 ENCOUNTER — Telehealth: Payer: Self-pay | Admitting: Cardiology

## 2011-04-07 ENCOUNTER — Encounter: Payer: Self-pay | Admitting: Physician Assistant

## 2011-04-07 ENCOUNTER — Ambulatory Visit (INDEPENDENT_AMBULATORY_CARE_PROVIDER_SITE_OTHER): Payer: 59 | Admitting: Physician Assistant

## 2011-04-07 VITALS — BP 108/74 | Ht 70.0 in | Wt 182.0 lb

## 2011-04-07 DIAGNOSIS — I2510 Atherosclerotic heart disease of native coronary artery without angina pectoris: Secondary | ICD-10-CM

## 2011-04-07 DIAGNOSIS — I10 Essential (primary) hypertension: Secondary | ICD-10-CM

## 2011-04-07 DIAGNOSIS — E7800 Pure hypercholesterolemia, unspecified: Secondary | ICD-10-CM

## 2011-04-07 NOTE — Telephone Encounter (Signed)
Letter written and the pt notified of Dr Rosalyn Charters recommendations. The pt requested that letter be mailed to [employer] [address] [place], [place] [address], Attn: Caryl Bis.

## 2011-04-07 NOTE — Telephone Encounter (Signed)
Per Dr Riley Kill this pt can return to work on 04/18/11 and cannot lift greater that 25 lbs the first 2 weeks back at work. After 2 weeks back at work the pt has no restrictions.

## 2011-04-07 NOTE — Progress Notes (Signed)
[address] [place], [place]  [address] Phone: [phone] Fax:  [phone]  Date:  04/07/2011   Name:  [name]       DOB:  1960/04/24 MRN:  [id number]  PCP:  Dr.  Everlene Other Primary Cardiologist:  Dr.  Shawnie Pons  Primary Electrophysiologist:  None    History of Present Illness: [name] is a 51 y.o. male who presents for post hospital follow up.  He has a history of CAD, status post anterior STEMI 3/09 treated with a BMS to the LAD, hypertension, hyperlipidemia.  Myoview 1/13: Distal anterior septal scar, no ischemia.  Echocardiogram 7/09: EF 55%, mild LAE.  He was admitted 2/27-3/1 with an NSTEMI.  He initially presented to the [facility] emergency room with minimal inferior ST elevation which resolved with heparin, IV nitroglycerin and morphine.  He still had chest discomfort when he arrived at [facility] and urgent LHC was performed 03/30/11: Mid LAD stent patent, proximal D1 50%, proximal circumflex 40%, mid circumflex 99%, mid RCA with mild plaque, EF 60-65%.  PCI: Promus DES x2 to the mid circumflex.  Labs: Hemoglobin 15.7, potassium 3.7, creatinine 0.83, ALT 26, peak troponin 8.89, total cholesterol 173, HDL 54, LDL 81, triglycerides 191.  Doing well since his MI.  The patient denies chest pain, syncope, orthopnea, PND or significant pedal edema.  He has noted some dyspnea with more extreme activities.  He is walking about 20 minutes a day without problems.  He is taking all of his medications.  He is not sure his insurance will cover cardiac rehab.    Past Medical History  Diagnosis Date  . Coronary artery disease     a. 04/2007 - Ant Stemi: LM: nl, LAD: 100p ( 2.75 x 28 Vision BMS ), LCX: 70-75, RI: 40/50, RCA: 45m, EF 35-40%.;  b. 01/2011 - Abnl ETT (Inf-Lat ST dep);  c.  02/2011 - Ex MV:  Dist ant-sept Infarct.  No ischemia.;  d. 03/30/11 NSTEMI - Cath: 99 LCX into OM1, otw nonobs dzs & NL LV - LCX stented w/ 2 promus DES  . Hypertension   .  Hypercholesteremia   . Habitual alcohol use     a. at least a 12 pack/wk (03/30/2011)    Current Outpatient Prescriptions  Medication Sig Dispense Refill  . aspirin 81 MG chewable tablet Chew 81 mg by mouth daily.        Marland Kitchen atorvastatin (LIPITOR) 80 MG tablet Take 80 mg by mouth at bedtime.      . carvedilol (COREG) 6.25 MG tablet Take 1 tablet (6.25 mg total) by mouth 2 (two) times daily.  60 tablet  11  . nitroGLYCERIN (NITROSTAT) 0.4 MG SL tablet Place 1 tablet (0.4 mg total) under the tongue every 5 (five) minutes as needed for chest pain.  25 tablet  9  . ranitidine (ZANTAC) 150 MG capsule Take 150 mg by mouth every morning.       . Ticagrelor (BRILINTA) 90 MG TABS tablet Take 1 tablet (90 mg total) by mouth 2 (two) times daily.  60 tablet  6    Allergies: No Known Allergies  History  Substance Use Topics  . Smoking status: Never Smoker   . Smokeless tobacco: Never Used  . Alcohol Use: No     states he has quit drinking alcohol     PHYSICAL EXAM: VS:  BP 108/74  Ht 5\' 10"  (1.778 m)  Wt 182 lb (82.555 kg)  BMI  26.11 kg/m2 Well nourished, well developed, in no acute distress HEENT: normal Neck: no JVD Cardiac:  normal S1, S2; RRR; no murmur Lungs:  clear to auscultation bilaterally, no wheezing, rhonchi or rales Abd: soft, nontender, no hepatomegaly Ext: no edema; right radial site without hematoma or bruit  Skin: warm and dry Neuro:  CNs 2-12 intact, no focal abnormalities noted  EKG:  Sinus rhythm, heart rate 89, normal axis, Q waves in V1-V2, nonspecific ST-T wave changes  ASSESSMENT AND PLAN:  1. Coronary artery disease  Doing well post PCI for NSTEMI.  He is taking ASA and Brilinta.  He will check with his insurance and start cardiac rehab if they will cover.  Follow up with Dr.  Shawnie Pons in 6 weeks.   2. HYPERCHOLESTEROLEMIA  Continue Lipitor 80.   3. HYPERTENSION  Controlled.  Continue current therapy.      SignedTereso Newcomer, PA-C  10:04 AM  04/07/2011

## 2011-04-07 NOTE — Telephone Encounter (Signed)
New msg Pt was calling about getting a note for when he is to go back to work. Please let him know

## 2011-04-07 NOTE — Patient Instructions (Signed)
Your physician recommends that you schedule a follow-up appointment in: WITH DR. Riley Kill IN 6 WEEKS  NO CHANGES TODAY

## 2011-04-21 ENCOUNTER — Ambulatory Visit: Payer: 59 | Admitting: Cardiology

## 2011-04-22 ENCOUNTER — Ambulatory Visit: Payer: 59 | Admitting: Cardiology

## 2011-05-26 ENCOUNTER — Encounter: Payer: Self-pay | Admitting: Cardiology

## 2011-05-26 ENCOUNTER — Ambulatory Visit (INDEPENDENT_AMBULATORY_CARE_PROVIDER_SITE_OTHER): Payer: 59 | Admitting: Cardiology

## 2011-05-26 VITALS — BP 110/72 | HR 80 | Ht 70.5 in | Wt 184.0 lb

## 2011-05-26 DIAGNOSIS — I214 Non-ST elevation (NSTEMI) myocardial infarction: Secondary | ICD-10-CM

## 2011-05-26 DIAGNOSIS — I2510 Atherosclerotic heart disease of native coronary artery without angina pectoris: Secondary | ICD-10-CM

## 2011-05-26 DIAGNOSIS — I10 Essential (primary) hypertension: Secondary | ICD-10-CM

## 2011-05-26 DIAGNOSIS — E7800 Pure hypercholesterolemia, unspecified: Secondary | ICD-10-CM

## 2011-05-26 LAB — LIPID PANEL
Cholesterol: 142 mg/dL (ref 0–200)
HDL: 53 mg/dL (ref >39.00)
LDL Cholesterol: 51 mg/dL (ref 0–99)
Total CHOL/HDL Ratio: 3
Triglycerides: 190 mg/dL — ABNORMAL HIGH (ref 0.0–149.0)
VLDL: 38 mg/dL (ref 0.0–40.0)

## 2011-05-26 LAB — HEPATIC FUNCTION PANEL
Albumin: 4.3 g/dL (ref 3.5–5.2)
Total Protein: 7.1 g/dL (ref 6.0–8.3)

## 2011-05-26 NOTE — Assessment & Plan Note (Signed)
See under non STEMI.

## 2011-05-26 NOTE — Patient Instructions (Signed)
Your physician recommends that you have a FASTING LIPID and LIVER Profile today.  Your physician recommends that you schedule a follow-up appointment in: 2 MONTHS  Your physician recommends that you continue on your current medications as directed. Please refer to the Current Medication list given to you today.

## 2011-05-26 NOTE — Assessment & Plan Note (Signed)
Controlled on current meds.

## 2011-05-26 NOTE — Assessment & Plan Note (Signed)
Doing really well.  Continue meds.  Follow up in two months and at six months prob switch to plavix for cost, and bleeding reduction.

## 2011-05-26 NOTE — Assessment & Plan Note (Signed)
Need to recheck lipid and liver.  Watch LFTs

## 2011-05-26 NOTE — Progress Notes (Signed)
HPI:  Doing well since discharge from the hospital.  Denies any chest pain.  Feels good.  We reviewed his overall stent situation.  Mild shortness of breath only when climbing stairs.    Current Outpatient Prescriptions  Medication Sig Dispense Refill  . aspirin 81 MG tablet Take 81 mg by mouth daily.      Marland Kitchen atorvastatin (LIPITOR) 80 MG tablet Take 80 mg by mouth at bedtime.      Marland Kitchen b complex vitamins tablet Take 1 tablet by mouth as needed.      . carvedilol (COREG) 6.25 MG tablet Take 1 tablet (6.25 mg total) by mouth 2 (two) times daily.  60 tablet  11  . Multiple Vitamin (MULTIVITAMIN) tablet Take 1 tablet by mouth daily.      . nitroGLYCERIN (NITROSTAT) 0.4 MG SL tablet Place 1 tablet (0.4 mg total) under the tongue every 5 (five) minutes as needed for chest pain.  25 tablet  9  . ranitidine (ZANTAC) 150 MG capsule Take 75 mg by mouth 2 (two) times daily.       . Ticagrelor (BRILINTA) 90 MG TABS tablet Take 1 tablet (90 mg total) by mouth 2 (two) times daily.  60 tablet  6  . Zinc 100 MG TABS Take 100 mg by mouth as needed.        No Known Allergies  Past Medical History  Diagnosis Date  . Coronary artery disease     a. 04/2007 - Ant Stemi: LM: nl, LAD: 100p ( 2.75 x 28 Vision BMS ), LCX: 70-75, RI: 40/50, RCA: 30m, EF 35-40%.;  b. 01/2011 - Abnl ETT (Inf-Lat ST dep);  c.  02/2011 - Ex MV:  Dist ant-sept Infarct.  No ischemia.;  d. 03/30/11 NSTEMI - Cath: 99 LCX into OM1, otw nonobs dzs & NL LV - LCX stented w/ 2 promus DES  . Hypertension   . Hypercholesteremia   . Habitual alcohol use     a. at least a 12 pack/wk (03/30/2011)    Past Surgical History  Procedure Date  . Coronary stent placement     of the left anterior descending artery  . Cardiac catheterization     Family History  Problem Relation Age of Onset  . Coronary artery disease Mother 82    alive @ 66  . Coronary artery disease Father     had MI in his 39's - died @ 75  . Heart attack Brother 51    alive - 105      History   Social History  . Marital Status: Single    Spouse Name: N/A    Number of Children: N/A  . Years of Education: N/A   Occupational History  . 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 Smoking status: Never Smoker   . Smokeless tobacco: Never Used  . Alcohol Use: No     states he has quit drinking alcohol  . Drug Use: No  . Sexually Active: Not on file   Other Topics Concern  . Not on file   Social History Narrative   Lives in [place] by himself.  Works locally as a Stage manager.    ROS: Please see the HPI.  All other systems reviewed and negative.  PHYSICAL EXAM:  BP 110/72  Pulse 80  Ht 5' 10.5" (1.791 m)  Wt 184 lb (83.462 kg)  BMI 26.03 kg/m2  General: Well developed, well nourished, in no acute  distress. Head:  Normocephalic and atraumatic. Neck: no JVD Lungs: Clear to auscultation and percussion. Heart: Normal S1 and S2.  No murmur, rubs or gallops.  Pulses: Pulses normal in all 4 extremities. Extremities: No clubbing or cyanosis. No edema. Neurologic: Alert and oriented x 3.  EKG:  ASSESSMENT AND PLAN:

## 2011-06-07 ENCOUNTER — Telehealth: Payer: Self-pay | Admitting: Cardiology

## 2011-06-07 NOTE — Telephone Encounter (Signed)
error 

## 2011-07-28 ENCOUNTER — Ambulatory Visit: Payer: 59 | Admitting: Cardiology

## 2011-08-02 ENCOUNTER — Ambulatory Visit (INDEPENDENT_AMBULATORY_CARE_PROVIDER_SITE_OTHER): Payer: 59 | Admitting: Cardiology

## 2011-08-02 ENCOUNTER — Encounter: Payer: Self-pay | Admitting: Cardiology

## 2011-08-02 VITALS — BP 121/81 | HR 68 | Ht 70.5 in | Wt 184.0 lb

## 2011-08-02 DIAGNOSIS — I2510 Atherosclerotic heart disease of native coronary artery without angina pectoris: Secondary | ICD-10-CM

## 2011-08-02 DIAGNOSIS — I10 Essential (primary) hypertension: Secondary | ICD-10-CM

## 2011-08-02 DIAGNOSIS — E7800 Pure hypercholesterolemia, unspecified: Secondary | ICD-10-CM

## 2011-08-02 NOTE — Patient Instructions (Signed)
Your physician wants you to follow-up in: 3 MONTHS with Dr Riley Kill.  You will receive a reminder letter in the mail two months in advance. If you don't receive a letter, please call our office to schedule the follow-up appointment.  Your physician recommends that you continue on your current medications as directed. Please refer to the Current Medication list given to you today.

## 2011-08-02 NOTE — Progress Notes (Signed)
HPI:  Patient returns in followup. Overall he is pretty stable. He thinks his insurer is about plug on his use of ticagrelor.  He says he simply cannot afford this medicine if the co-pay is much higher. We talked in some detail about the possibility of using clopidogrel versus aging, and specifically with regard to the guidelines. He's not had any recurrent cardiac symptoms, and appears to be quite stable and doing well from a cardiac standpoint.  Current Outpatient Prescriptions  Medication Sig Dispense Refill  . aspirin 81 MG tablet Take 81 mg by mouth daily.      Marland Kitchen atorvastatin (LIPITOR) 80 MG tablet Take 80 mg by mouth at bedtime.      Marland Kitchen b complex vitamins tablet Take 1 tablet by mouth as needed.      . carvedilol (COREG) 6.25 MG tablet Take 1 tablet (6.25 mg total) by mouth 2 (two) times daily.  60 tablet  11  . Multiple Vitamin (MULTIVITAMIN) tablet Take 1 tablet by mouth daily.      . nitroGLYCERIN (NITROSTAT) 0.4 MG SL tablet Place 1 tablet (0.4 mg total) under the tongue every 5 (five) minutes as needed for chest pain.  25 tablet  9  . ranitidine (ZANTAC) 150 MG capsule Take 75 mg by mouth 2 (two) times daily.       . Ticagrelor (BRILINTA) 90 MG TABS tablet Take 1 tablet (90 mg total) by mouth 2 (two) times daily.  60 tablet  6  . Zinc 100 MG TABS Take 100 mg by mouth as needed.        No Known Allergies  Past Medical History  Diagnosis Date  . Coronary artery disease     a. 04/2007 - Ant Stemi: LM: nl, LAD: 100p ( 2.75 x 28 Vision BMS ), LCX: 70-75, RI: 40/50, RCA: 53m, EF 35-40%.;  b. 01/2011 - Abnl ETT (Inf-Lat ST dep);  c.  02/2011 - Ex MV:  Dist ant-sept Infarct.  No ischemia.;  d. 03/30/11 NSTEMI - Cath: 99 LCX into OM1, otw nonobs dzs & NL LV - LCX stented w/ 2 promus DES  . Hypertension   . Hypercholesteremia   . Habitual alcohol use     a. at least a 12 pack/wk (03/30/2011)    Past Surgical History  Procedure Date  . Coronary stent placement     of the left anterior  descending artery  . Cardiac catheterization     Family History  Problem Relation Age of Onset  . Coronary artery disease Mother 56    alive @ 5  . Coronary artery disease Father     had MI in his 4's - died @ 26  . Heart attack Brother 51    alive - 24    History   Social History  . Marital Status: Single    Spouse Name: N/A    Number of Children: N/A  . Years of Education: N/A   Occupational History  . 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 Smoking status: Never Smoker   . Smokeless tobacco: Never Used  . Alcohol Use: No     states he has quit drinking alcohol  . Drug Use: No  . Sexually Active: Not on file   Other Topics Concern  . Not on file   Social History Narrative   Lives in [place] by himself.  Works locally as a Stage manager.    ROS: Please see the  HPI.  All other systems reviewed and negative.  PHYSICAL EXAM:  BP 121/81  Pulse 68  Ht 5' 10.5" (1.791 m)  Wt 184 lb (83.462 kg)  BMI 26.03 kg/m2  General: Well developed, well nourished, in no acute distress. Head:  Normocephalic and atraumatic. Neck: no JVD Lungs: Clear to auscultation and percussion. Heart: Normal S1 and S2.  No murmur, rubs or gallops.  Abdomen:  Normal bowel sounds; soft; non tender; no organomegaly Pulses: Pulses normal in all 4 extremities. Extremities: No clubbing or cyanosis. No edema. Neurologic: Alert and oriented x 3.  EKG:  NSR.  Possible anterior MI, old.  No change from prior tracing except lead position in lead V3.  ASSESSMENT AND PLAN:

## 2011-08-07 NOTE — Assessment & Plan Note (Signed)
Currently well controlled.  

## 2011-08-07 NOTE — Assessment & Plan Note (Signed)
Overall he is doing well.  He is not having ischemic symptoms.  Worried about the cost of his medication.  Discussed possibly switching at this point.  He is waiting to see what his copay might be.

## 2011-08-07 NOTE — Assessment & Plan Note (Signed)
LDL is at target.  Trigylcerides remain elevated.  Would continue on current medications as at present.

## 2011-08-30 ENCOUNTER — Telehealth: Payer: Self-pay | Admitting: Cardiology

## 2011-08-30 NOTE — Telephone Encounter (Signed)
New Problem:    Patient called in needing a refill of the generic Plavix.  Please call back once the order has been placed.

## 2011-10-03 ENCOUNTER — Other Ambulatory Visit: Payer: Self-pay | Admitting: Cardiology

## 2011-10-26 ENCOUNTER — Telehealth: Payer: Self-pay | Admitting: Cardiology

## 2011-10-26 NOTE — Telephone Encounter (Signed)
Spoke at length with Dr. Warren Danes about patient.  He needs a tooth removed.  He says he has no problem with a couple of days---I suggested three days off treatment.  I reviewed with Dr. Warren Danes the pharmacokinetics of ticagrelor.  He is aware and is not concerned because of the local treatment that he uses.  I relayed to him the time frame of his stent placement.

## 2011-11-07 ENCOUNTER — Other Ambulatory Visit (HOSPITAL_COMMUNITY): Payer: Self-pay | Admitting: Nurse Practitioner

## 2011-11-08 ENCOUNTER — Ambulatory Visit (INDEPENDENT_AMBULATORY_CARE_PROVIDER_SITE_OTHER): Payer: BC Managed Care – PPO | Admitting: Cardiology

## 2011-11-08 ENCOUNTER — Encounter: Payer: Self-pay | Admitting: Cardiology

## 2011-11-08 VITALS — BP 92/78 | HR 87 | Ht 70.0 in | Wt 187.0 lb

## 2011-11-08 DIAGNOSIS — E7800 Pure hypercholesterolemia, unspecified: Secondary | ICD-10-CM

## 2011-11-08 DIAGNOSIS — I10 Essential (primary) hypertension: Secondary | ICD-10-CM

## 2011-11-08 DIAGNOSIS — I2510 Atherosclerotic heart disease of native coronary artery without angina pectoris: Secondary | ICD-10-CM

## 2011-11-08 LAB — CBC WITH DIFFERENTIAL/PLATELET
Basophils Absolute: 0 10*3/uL (ref 0.0–0.1)
Eosinophils Relative: 4.1 % (ref 0.0–5.0)
HCT: 42.4 % (ref 39.0–52.0)
Hemoglobin: 14.3 g/dL (ref 13.0–17.0)
Lymphocytes Relative: 25.8 % (ref 12.0–46.0)
Lymphs Abs: 1.1 10*3/uL (ref 0.7–4.0)
Monocytes Relative: 8.3 % (ref 3.0–12.0)
Neutro Abs: 2.6 10*3/uL (ref 1.4–7.7)
RBC: 4.5 Mil/uL (ref 4.22–5.81)
WBC: 4.2 10*3/uL — ABNORMAL LOW (ref 4.5–10.5)

## 2011-11-08 NOTE — Assessment & Plan Note (Signed)
Stable at present.  No recurrent angina.  Continue DAPT for one year, then ASA alone.

## 2011-11-08 NOTE — Patient Instructions (Addendum)
Your physician recommends that you have lab work today: CBC  Your physician recommends that you schedule a follow-up appointment in: February with Dr Riley Kill  Your physician recommends that you continue on your current medications as directed. Please refer to the Current Medication list given to you today.

## 2011-11-08 NOTE — Assessment & Plan Note (Signed)
Borderline today, so will check CBC.

## 2011-11-08 NOTE — Assessment & Plan Note (Signed)
LDL was at target.  Recheck in the spring.

## 2011-11-08 NOTE — Progress Notes (Signed)
HPI:  The patient is in for followup. He underwent surgery by Dr. Warren Danes, and overall is doing well. He is due antiplatelet therapy for approximately 3 days, then resume has had no bleeding problems. He denies any chest pain, and feels well. He bought use treadmill, and his planning to exercise on a regular basis. He's had no evidence of any type of bleeding to her knowledge.  Current Outpatient Prescriptions  Medication Sig Dispense Refill  . amoxicillin (AMOXIL) 500 MG capsule Take 1 tablet by mouth Three times a day.      Marland Kitchen aspirin 81 MG tablet Take 81 mg by mouth daily.      Marland Kitchen atorvastatin (LIPITOR) 80 MG tablet TAKE 1 TABLET BY MOUTH DAILY  30 tablet  9  . b complex vitamins tablet Take 1 tablet by mouth as needed.      Marland Kitchen BRILINTA 90 MG TABS tablet TAKE 1 TABLET (90 MG TOTAL) BY MOUTH 2 (TWO) TIMES DAILY.  60 tablet  6  . carvedilol (COREG) 6.25 MG tablet Take 1 tablet (6.25 mg total) by mouth 2 (two) times daily.  60 tablet  11  . Multiple Vitamin (MULTIVITAMIN) tablet Take 1 tablet by mouth daily.      . nitroGLYCERIN (NITROSTAT) 0.4 MG SL tablet Place 0.4 mg under the tongue every 5 (five) minutes as needed.      . ranitidine (ZANTAC) 150 MG capsule Take 75 mg by mouth 2 (two) times daily.       . Zinc 100 MG TABS Take 100 mg by mouth as needed.      Marland Kitchen DISCONTD: atorvastatin (LIPITOR) 80 MG tablet Take 80 mg by mouth at bedtime.      Marland Kitchen DISCONTD: nitroGLYCERIN (NITROSTAT) 0.4 MG SL tablet Place 1 tablet (0.4 mg total) under the tongue every 5 (five) minutes as needed for chest pain.  25 tablet  9    No Known Allergies  Past Medical History  Diagnosis Date  . Coronary artery disease     a. 04/2007 - Ant Stemi: LM: nl, LAD: 100p ( 2.75 x 28 Vision BMS ), LCX: 70-75, RI: 40/50, RCA: 25m, EF 35-40%.;  b. 01/2011 - Abnl ETT (Inf-Lat ST dep);  c.  02/2011 - Ex MV:  Dist ant-sept Infarct.  No ischemia.;  d. 03/30/11 NSTEMI - Cath: 99 LCX into OM1, otw nonobs dzs & NL LV - LCX stented w/ 2  promus DES  . Hypertension   . Hypercholesteremia   . Habitual alcohol use     a. at least a 12 pack/wk (03/30/2011)    Past Surgical History  Procedure Date  . Coronary stent placement     of the left anterior descending artery  . Cardiac catheterization     Family History  Problem Relation Age of Onset  . Coronary artery disease Mother 31    alive @ 42  . Coronary artery disease Father     had MI in his 55's - died @ 11  . Heart attack Brother 51    alive - 67    History   Social History  . Marital Status: Single    Spouse Name: N/A    Number of Children: N/A  . Years of Education: N/A   Occupational History  . 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 Smoking status: Never Smoker   . Smokeless tobacco: Never Used  . Alcohol Use: No     states he  has quit drinking alcohol  . Drug Use: No  . Sexually Active: Not on file   Other Topics Concern  . Not on file   Social History Narrative   Lives in [place] by himself.  Works locally as a Stage manager.    ROS: Please see the HPI.  All other systems reviewed and negative.  PHYSICAL EXAM:  BP 92/78  Pulse 87  Ht 5\' 10"  (1.778 m)  Wt 187 lb (84.823 kg)  BMI 26.83 kg/m2  SpO2 98%  BP 108/60 by me.   General: Well developed, well nourished, in no acute distress. Head:  Normocephalic and atraumatic. Neck: no JVD Lungs: Clear to auscultation and percussion. Heart: Normal S1 and S2.  No murmur, rubs or gallops.  Pulses: Pulses normal in all 4 extremities. Extremities: No clubbing or cyanosis. No edema. Neurologic: Alert and oriented x 3.  EKG:NSR.  WNL  ASSESSMENT AND PLAN:

## 2011-11-19 ENCOUNTER — Other Ambulatory Visit: Payer: Self-pay | Admitting: Cardiology

## 2011-11-24 ENCOUNTER — Telehealth: Payer: Self-pay | Admitting: Cardiology

## 2011-11-24 DIAGNOSIS — D72819 Decreased white blood cell count, unspecified: Secondary | ICD-10-CM

## 2011-11-24 NOTE — Telephone Encounter (Signed)
I left a message on the pt's voicemail in regards to CBC results. The pt will need to have this rechecked on 12/09/11.

## 2011-11-24 NOTE — Telephone Encounter (Signed)
New Problem: ° ° ° °Patient returned your call about his lab work.  Please call back. °

## 2011-12-06 ENCOUNTER — Telehealth: Payer: Self-pay | Admitting: Cardiology

## 2011-12-06 NOTE — Telephone Encounter (Signed)
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  If you would answer the following questions and leave a message on his voice mail it would be appreciated.   -Patient would like to know what his cholesterol numbers  Were - Please explain what, low white blood cell counts means at this point    -pt is planning on coming in Friday day morning to re take labs  Please leave answers to these questions and any additional info on voicemail.

## 2011-12-07 NOTE — Telephone Encounter (Signed)
I left a detailed message on the pt's voicemail. We did not check a lipid panel with the most recent lab work, only a CBC was checked. At this time the low WBC is not of significance.  Dr Riley Kill would just like CBC rechecked as planned and then we will make further recommendations.

## 2011-12-09 ENCOUNTER — Other Ambulatory Visit (INDEPENDENT_AMBULATORY_CARE_PROVIDER_SITE_OTHER): Payer: BC Managed Care – PPO

## 2011-12-09 DIAGNOSIS — D72819 Decreased white blood cell count, unspecified: Secondary | ICD-10-CM

## 2011-12-09 LAB — CBC WITH DIFFERENTIAL/PLATELET
Eosinophils Relative: 3.3 % (ref 0.0–5.0)
HCT: 45.8 % (ref 39.0–52.0)
Hemoglobin: 15.4 g/dL (ref 13.0–17.0)
Lymphs Abs: 1.3 10*3/uL (ref 0.7–4.0)
MCV: 94.8 fl (ref 78.0–100.0)
Monocytes Absolute: 0.5 10*3/uL (ref 0.1–1.0)
Monocytes Relative: 8.1 % (ref 3.0–12.0)
Neutro Abs: 3.9 10*3/uL (ref 1.4–7.7)
Platelets: 160 10*3/uL (ref 150.0–400.0)
RDW: 13.6 % (ref 11.5–14.6)
WBC: 5.9 10*3/uL (ref 4.5–10.5)

## 2011-12-12 ENCOUNTER — Other Ambulatory Visit: Payer: Self-pay | Admitting: *Deleted

## 2011-12-12 MED ORDER — CARVEDILOL 6.25 MG PO TABS: 6.2500 mg | ORAL_TABLET | Freq: Two times a day (BID) | ORAL | Status: DC

## 2012-03-14 ENCOUNTER — Encounter: Payer: Self-pay | Admitting: Cardiology

## 2012-03-14 ENCOUNTER — Ambulatory Visit (INDEPENDENT_AMBULATORY_CARE_PROVIDER_SITE_OTHER): Payer: BC Managed Care – PPO | Admitting: Cardiology

## 2012-03-14 VITALS — BP 132/82 | HR 77 | Ht 70.0 in | Wt 186.0 lb

## 2012-03-14 DIAGNOSIS — I2510 Atherosclerotic heart disease of native coronary artery without angina pectoris: Secondary | ICD-10-CM

## 2012-03-14 DIAGNOSIS — E785 Hyperlipidemia, unspecified: Secondary | ICD-10-CM

## 2012-03-14 DIAGNOSIS — E7800 Pure hypercholesterolemia, unspecified: Secondary | ICD-10-CM

## 2012-03-14 DIAGNOSIS — I10 Essential (primary) hypertension: Secondary | ICD-10-CM

## 2012-03-14 NOTE — Patient Instructions (Addendum)
Your physician recommends that you return for fasting lab work.  Your physician wants you to follow-up in: 6 months with Dr. Clifton James.  You will receive a reminder letter in the mail two months in advance. If you don't receive a letter, please call our office to schedule the follow-up appointment.

## 2012-03-14 NOTE — Assessment & Plan Note (Signed)
The patient has two-vessel coronary artery disease. He currently remains stable. He has overlapping stents in the circumflex coronary artery there drug-eluting second-generation. According to the guidelines, he should be treated with one year of dual antiplatelet therapy. After the end of the month, he can come off of treatment. He should remain on aspirin indefinitely. I discussed at great length the discussion points regarding decision making with regard to dual antiplatelet therapy.

## 2012-03-14 NOTE — Progress Notes (Signed)
HPI:  Patient is in for followup visit. From a cardiac standpoint he is totally stable. He's gotten a treadmill, and has not been having any troubles at all. He is out nearly a year from undergoing percutaneous stenting of the circumflex artery. At that time he had 2 drug-eluting stents placed.  He exercises without difficulty.  We also reviewed his lipid status.  The report is as follows  Angiographic Findings:  Left main: No evidence of disease.  Left Anterior Descending Artery: Large vessel that courses to the apex. The stent in the mid vessel is patent with minimal in-stent restenosis. Moderate sized diagonal branch with 50% proximal stenosis.  Circumflex Artery: Early intermediate branch with 40% proximal stenosis. The mid vessel has a 99% diffuse stenosis extending down into the first obtuse marginal branch. This is a small to moderate sized vessel. The AV groove Circumflex is very small in caliber beyond the takeoff of the OM branch.  Right Coronary Artery: Large dominant vessel with mild 205 plaque in the mid vessel.  Left Ventricular Angiogram: LVEF 60-65%. NO wall motion abormalities. No MR noted.  Impression:  1. Double vessel CAD  2. NSTEMI secondary to severe stenosis mid Circumflex/OM1.  3. Patent stent mid LAD  4. Successful PTCA/DES x2 (overlapping stents) mid Circumflex/OM1.  5. Preserved LV systolic function.  Recommendations: ASABrilinta for at least one year.  Complications: None. The patient tolerated the procedure well.     Current Outpatient Prescriptions  Medication Sig Dispense Refill  . aspirin 81 MG tablet Take 81 mg by mouth daily.      Marland Kitchen atorvastatin (LIPITOR) 80 MG tablet TAKE 1 TABLET BY MOUTH DAILY  30 tablet  9  . b complex vitamins tablet Take 1 tablet by mouth as needed.      Marland Kitchen BRILINTA 90 MG TABS tablet TAKE 1 TABLET (90 MG TOTAL) BY MOUTH 2 (TWO) TIMES DAILY.  60 tablet  6  . carvedilol (COREG) 6.25 MG tablet Take 1 tablet (6.25 mg total) by mouth 2  (two) times daily.  60 tablet  6  . Multiple Vitamin (MULTIVITAMIN) tablet Take 1 tablet by mouth daily.      Marland Kitchen NITROSTAT 0.4 MG SL tablet PLACE 1 TABLET UNDER THE TONGUE EVERY 5 (FIVE) MINUTES AS NEEDED FOR CHEST PAIN.  25 tablet  1  . ranitidine (ZANTAC) 150 MG capsule Take 75 mg by mouth 2 (two) times daily.       . Zinc 100 MG TABS Take 100 mg by mouth as needed.       No current facility-administered medications for this visit.    No Known Allergies  Past Medical History  Diagnosis Date  . Coronary artery disease     a. 04/2007 - Ant Stemi: LM: nl, LAD: 100p ( 2.75 x 28 Vision BMS ), LCX: 70-75, RI: 40/50, RCA: 55m, EF 35-40%.;  b. 01/2011 - Abnl ETT (Inf-Lat ST dep);  c.  02/2011 - Ex MV:  Dist ant-sept Infarct.  No ischemia.;  d. 03/30/11 NSTEMI - Cath: 99 LCX into OM1, otw nonobs dzs & NL LV - LCX stented w/ 2 promus DES  . Hypertension   . Hypercholesteremia   . Habitual alcohol use     a. at least a 12 pack/wk (03/30/2011)    Past Surgical History  Procedure Laterality Date  . Coronary stent placement      of the left anterior descending artery  . Cardiac catheterization      Family History  Problem Relation Age of Onset  . Coronary artery disease Mother 3    alive @ 38  . Coronary artery disease Father     had MI in his 29's - died @ 90  . Heart attack Brother 51    alive - 46    History   Social History  . Marital Status: Single    Spouse Name: N/A    Number of Children: N/A  . Years of Education: N/A   Occupational History  . 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 Smoking status: Never Smoker   . Smokeless tobacco: Never Used  . Alcohol Use: No     Comment: states he has quit drinking alcohol  . Drug Use: No  . Sexually Active: Not on file   Other Topics Concern  . Not on file   Social History Narrative   Lives in [place] by himself.  Works locally as a Stage manager.    ROS: Please see the HPI.  All other  systems reviewed and negative.  PHYSICAL EXAM:  BP 132/82  Pulse 77  Ht 5\' 10"  (1.778 m)  Wt 186 lb (84.369 kg)  BMI 26.69 kg/m2  General: Well developed, well nourished, in no acute distress. Head:  Normocephalic and atraumatic. Neck: no JVD Lungs: Clear to auscultation and percussion. Heart: Normal S1 and S2.  No murmur, rubs or gallops.  Abdomen:  Normal bowel sounds; soft; non tender; no organomegaly Pulses: Pulses normal in all 4 extremities. Extremities: No clubbing or cyanosis. No edema. Neurologic: Alert and oriented x 3.  EKG:  NSR. WNL.   ASSESSMENT AND PLAN:

## 2012-03-14 NOTE — Assessment & Plan Note (Signed)
Is doing April for lipid liver profile, and we will obtain at this time. Followup will be with Dr. Clifton James

## 2012-03-15 NOTE — Telephone Encounter (Signed)
See my note.  Patient seen back in follow up and doing well. TS

## 2012-03-19 ENCOUNTER — Other Ambulatory Visit: Payer: BC Managed Care – PPO

## 2012-03-23 ENCOUNTER — Other Ambulatory Visit (INDEPENDENT_AMBULATORY_CARE_PROVIDER_SITE_OTHER): Payer: BC Managed Care – PPO

## 2012-03-23 DIAGNOSIS — E785 Hyperlipidemia, unspecified: Secondary | ICD-10-CM

## 2012-03-23 LAB — HEPATIC FUNCTION PANEL
ALT: 27 U/L (ref 0–53)
AST: 23 U/L (ref 0–37)
Albumin: 4.1 g/dL (ref 3.5–5.2)
Total Bilirubin: 1.2 mg/dL (ref 0.3–1.2)

## 2012-03-23 LAB — LIPID PANEL
Cholesterol: 149 mg/dL (ref 0–200)
Triglycerides: 140 mg/dL (ref 0.0–149.0)

## 2012-07-25 ENCOUNTER — Other Ambulatory Visit: Payer: Self-pay | Admitting: Cardiology

## 2012-08-26 ENCOUNTER — Other Ambulatory Visit: Payer: Self-pay | Admitting: Cardiology

## 2012-10-04 ENCOUNTER — Encounter: Payer: Self-pay | Admitting: Cardiovascular Disease

## 2012-10-04 ENCOUNTER — Ambulatory Visit (INDEPENDENT_AMBULATORY_CARE_PROVIDER_SITE_OTHER): Payer: BC Managed Care – PPO | Admitting: Cardiovascular Disease

## 2012-10-04 VITALS — BP 114/80 | HR 77 | Ht 70.0 in | Wt 195.0 lb

## 2012-10-04 DIAGNOSIS — I10 Essential (primary) hypertension: Secondary | ICD-10-CM

## 2012-10-04 DIAGNOSIS — E785 Hyperlipidemia, unspecified: Secondary | ICD-10-CM

## 2012-10-04 DIAGNOSIS — I2510 Atherosclerotic heart disease of native coronary artery without angina pectoris: Secondary | ICD-10-CM

## 2012-10-04 NOTE — Patient Instructions (Addendum)
Your physician wants you to follow-up in:  6 months. You will receive a reminder letter in the mail two months in advance. If you don't receive a letter, please call our office to schedule the follow-up appointment.   

## 2012-10-04 NOTE — Progress Notes (Signed)
History of Present Illness: 52 yo male with history of CAD, HTN, HLD who is here today for cardiac follow up. He has been followed in the past by Dr. Riley Kill. He presented in March 2009 with an anterior STEMI. A 2.75 x 28 mm bare metal stent was placed in the proximal LAD. He was readmitted February 2013 with unstable angina. Cardiac cath 03/30/11 with severe stenosis OM1 treated with overlapping DES (2.25 x 32 mm Promus Element DES, 2.25 x 16 mm Promus Element DES). There was a 50% diagonal stenosis, 20% RCA stenosis.   He is here today for follow up. He denies chest pain, SOB, palpitations. He is exercising several times per week.   Primary Care Physician: Tracey Harries  Last Lipid Profile:Lipid Panel     Component Value Date/Time   CHOL 149 03/23/2012 1003   TRIG 140.0 03/23/2012 1003   HDL 56.70 03/23/2012 1003   CHOLHDL 3 03/23/2012 1003   VLDL 28.0 03/23/2012 1003   LDLCALC 64 03/23/2012 1003    Past Medical History  Diagnosis Date  . Coronary artery disease     a. 04/2007 - Ant Stemi: LM: nl, LAD: 100p ( 2.75 x 28 Vision BMS ), LCX: 70-75, RI: 40/50, RCA: 102m, EF 35-40%.;  b. 01/2011 - Abnl ETT (Inf-Lat ST dep);  c.  02/2011 - Ex MV:  Dist ant-sept Infarct.  No ischemia.;  d. 03/30/11 NSTEMI - Cath: 99 LCX into OM1, otw nonobs dzs & NL LV - LCX stented w/ 2 promus DES  . Hypertension   . Hypercholesteremia   . Habitual alcohol use     a. at least a 12 pack/wk (03/30/2011)    Past Surgical History  Procedure Laterality Date  . Coronary stent placement      of the left anterior descending artery  . Cardiac catheterization      Current Outpatient Prescriptions  Medication Sig Dispense Refill  . aspirin 81 MG tablet Take 81 mg by mouth daily.      Marland Kitchen atorvastatin (LIPITOR) 80 MG tablet TAKE 1 TABLET BY MOUTH DAILY  30 tablet  9  . b complex vitamins tablet Take 1 tablet by mouth as needed.      . carvedilol (COREG) 6.25 MG tablet TAKE 1 TABLET (6.25 MG TOTAL) BY MOUTH 2 (TWO) TIMES  DAILY.  60 tablet  6  . Multiple Vitamin (MULTIVITAMIN) tablet Take 1 tablet by mouth daily.      Marland Kitchen NITROSTAT 0.4 MG SL tablet PLACE 1 TABLET UNDER THE TONGUE EVERY 5 (FIVE) MINUTES AS NEEDED FOR CHEST PAIN.  25 tablet  1  . ranitidine (ZANTAC) 150 MG capsule Take 75 mg by mouth 2 (two) times daily.       . Zinc 100 MG TABS Take 100 mg by mouth as needed.       No current facility-administered medications for this visit.    No Known Allergies  History   Social History  . Marital Status: Single    Spouse Name: N/A    Number of Children: N/A  . Years of Education: N/A   Occupational History  . 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 Smoking status: Never Smoker   . Smokeless tobacco: Never Used  . Alcohol Use: No     Comment: states he has quit drinking alcohol  . Drug Use: No  . Sexual Activity: Not on file   Other Topics Concern  . Not on file  Social History Narrative   Lives in [place] by himself.  Works locally as a Stage manager.    Family History  Problem Relation Age of Onset  . Coronary artery disease Mother 16    alive @ 7  . Coronary artery disease Father     had MI in his 79's - died @ 10  . Heart attack Brother 51    alive - 48    Review of Systems:  As stated in the HPI and otherwise negative.   BP 114/80  Pulse 77  Ht 5\' 10"  (1.778 m)  Wt 195 lb (88.451 kg)  BMI 27.98 kg/m2  Physical Examination: General: Well developed, well nourished, NAD HEENT: OP clear, mucus membranes moist SKIN: warm, dry. No rashes. Neuro: No focal deficits Musculoskeletal: Muscle strength 5/5 all ext Psychiatric: Mood and affect normal Neck: No JVD, no carotid bruits, no thyromegaly, no lymphadenopathy. Lungs:Clear bilaterally, no wheezes, rhonci, crackles Cardiovascular: Regular rate and rhythm. No murmurs, gallops or rubs. Abdomen:Soft. Bowel sounds present. Non-tender.  Extremities: No lower extremity edema. Pulses are 2 + in the  bilateral DP/PT.  Cardiac cath 03/30/11: Left main: No evidence of disease.  Left Anterior Descending Artery: Large vessel that courses to the apex. The stent in the mid vessel is patent with minimal in-stent restenosis. Moderate sized diagonal branch with 50% proximal stenosis.  Circumflex Artery: Early intermediate branch with 40% proximal stenosis. The mid vessel has a 99% diffuse stenosis extending down into the first obtuse marginal branch. This is a small to moderate sized vessel. The AV groove Circumflex is very small in caliber beyond the takeoff of the OM branch.  Right Coronary Artery: Large dominant vessel with mild 205 plaque in the mid vessel.  Left Ventricular Angiogram: LVEF 60-65%. NO wall motion abormalities. No MR noted.  Impression:  1. Double vessel CAD  2. NSTEMI secondary to severe stenosis mid Circumflex/OM1.  3. Patent stent mid LAD  4. Successful PTCA/DES x2 (overlapping stents) mid Circumflex/OM1.  5. Preserved LV systolic function.  Recommendations: ASABrilinta for at least one year.  Complications: None. The patient tolerated the procedure well.   Assessment and Plan:   1. CAD: Stable. Continue current therapy.   2. HTN: BP controlled.   3. HLD: Lipids well controlled on statin.

## 2013-02-26 ENCOUNTER — Other Ambulatory Visit: Payer: Self-pay | Admitting: Cardiovascular Disease

## 2013-05-04 ENCOUNTER — Other Ambulatory Visit: Payer: Self-pay | Admitting: Cardiovascular Disease

## 2013-05-28 ENCOUNTER — Encounter: Payer: Self-pay | Admitting: Cardiovascular Disease

## 2013-05-28 ENCOUNTER — Encounter (INDEPENDENT_AMBULATORY_CARE_PROVIDER_SITE_OTHER): Payer: Self-pay

## 2013-05-28 ENCOUNTER — Ambulatory Visit (INDEPENDENT_AMBULATORY_CARE_PROVIDER_SITE_OTHER): Payer: BC Managed Care – PPO | Admitting: Cardiovascular Disease

## 2013-05-28 VITALS — BP 142/94 | HR 84 | Ht 70.0 in | Wt 199.0 lb

## 2013-05-28 DIAGNOSIS — E785 Hyperlipidemia, unspecified: Secondary | ICD-10-CM

## 2013-05-28 DIAGNOSIS — I2510 Atherosclerotic heart disease of native coronary artery without angina pectoris: Secondary | ICD-10-CM

## 2013-05-28 DIAGNOSIS — I10 Essential (primary) hypertension: Secondary | ICD-10-CM

## 2013-05-28 LAB — LIPID PANEL
CHOLESTEROL: 183 mg/dL (ref 0–200)
HDL: 56.3 mg/dL (ref >39.00)
LDL Cholesterol: 81 mg/dL (ref 0–99)
Total CHOL/HDL Ratio: 3
Triglycerides: 231 mg/dL — ABNORMAL HIGH (ref 0.0–149.0)
VLDL: 46.2 mg/dL — ABNORMAL HIGH (ref 0.0–40.0)

## 2013-05-28 LAB — HEPATIC FUNCTION PANEL
ALK PHOS: 80 U/L (ref 39–117)
ALT: 42 U/L (ref 0–53)
AST: 39 U/L — AB (ref 0–37)
Albumin: 4.4 g/dL (ref 3.5–5.2)
BILIRUBIN DIRECT: 0.2 mg/dL (ref 0.0–0.3)
TOTAL PROTEIN: 7.3 g/dL (ref 6.0–8.3)
Total Bilirubin: 1.3 mg/dL — ABNORMAL HIGH (ref 0.3–1.2)

## 2013-05-28 MED ORDER — CLOPIDOGREL BISULFATE 75 MG PO TABS
75.0000 mg | ORAL_TABLET | Freq: Every day | ORAL | Status: DC
Start: 2013-05-28 — End: 2014-05-23

## 2013-05-28 NOTE — Progress Notes (Signed)
History of Present Illness: 53 yo male with history of CAD, HTN, HLD who is here today for cardiac follow up. He has been followed in the past by Dr. Riley KillStuckey. He presented in March 2009 with an anterior STEMI. A 2.75 x 28 mm bare metal stent was placed in the proximal LAD. He was readmitted February 2013 with unstable angina. Cardiac cath 03/30/11 with severe stenosis OM1 treated with overlapping DES (2.25 x 32 mm Promus Element DES, 2.25 x 16 mm Promus Element DES). There was a 50% diagonal stenosis, 20% RCA stenosis.   He is here today for follow up. He denies chest pain, SOB, palpitations. He is exercising several times per week.   Primary Care Physician: Tracey HarriesDavid Bouska  Last Lipid Profile:Lipid Panel     Component Value Date/Time   CHOL 149 03/23/2012 1003   TRIG 140.0 03/23/2012 1003   HDL 56.70 03/23/2012 1003   CHOLHDL 3 03/23/2012 1003   VLDL 28.0 03/23/2012 1003   LDLCALC 64 03/23/2012 1003    Past Medical History  Diagnosis Date  . Coronary artery disease     a. 04/2007 - Ant Stemi: LM: nl, LAD: 100p ( 2.75 x 28 Vision BMS ), LCX: 70-75, RI: 40/50, RCA: 5420m, EF 35-40%.;  b. 01/2011 - Abnl ETT (Inf-Lat ST dep);  c.  02/2011 - Ex MV:  Dist ant-sept Infarct.  No ischemia.;  d. 03/30/11 NSTEMI - Cath: 99 LCX into OM1, otw nonobs dzs & NL LV - LCX stented w/ 2 promus DES  . Hypertension   . Hypercholesteremia   . Habitual alcohol use     a. at least a 12 pack/wk (03/30/2011)  . PUD (peptic ulcer disease)     in his 1520s    Past Surgical History  Procedure Laterality Date  . None      Current Outpatient Prescriptions  Medication Sig Dispense Refill  . aspirin 81 MG tablet Take 81 mg by mouth daily.      Marland Kitchen. atorvastatin (LIPITOR) 80 MG tablet TAKE 1 TABLET BY MOUTH DAILY  30 tablet  9  . b complex vitamins tablet Take 1 tablet by mouth as needed.      . carvedilol (COREG) 6.25 MG tablet TAKE 1 TABLET (6.25 MG TOTAL) BY MOUTH 2 (TWO) TIMES DAILY.  60 tablet  0  . Multiple Vitamin  (MULTIVITAMIN) tablet Take 1 tablet by mouth daily.      Marland Kitchen. NITROSTAT 0.4 MG SL tablet PLACE 1 TABLET UNDER THE TONGUE EVERY 5 (FIVE) MINUTES AS NEEDED FOR CHEST PAIN.  25 tablet  1  . ranitidine (ZANTAC) 150 MG capsule Take 75 mg by mouth 2 (two) times daily.       . Zinc 100 MG TABS Take 100 mg by mouth as needed.       No current facility-administered medications for this visit.    No Known Allergies  History   Social History  . Marital Status: Single    Spouse Name: N/A    Number of Children: 0  . Years of Education: N/A   Occupational History  . works Clinical biochemistcustomer service [employer]    Social History Main Topics  . Smoking status: Never Smoker   . Smokeless tobacco: Never Used  . Alcohol Use: 5.0 oz/week    10 drink(s) per week     Comment: states he has quit drinking alcohol  . Drug Use: No  . Sexual Activity: Not on file   Other Topics Concern  .  Not on file   Social History Narrative   [place] in [place] by himself.  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    Family History  Problem Relation Age of Onset  . Coronary artery disease Mother 7470    alive @ 7477  . Coronary artery disease Father     had MI in his 8660's - died @ 487  . Heart attack Brother 51    alive - 4854    Review of Systems:  As stated in the HPI and otherwise negative.   BP 142/94  Pulse 84  Ht 5\' 10"  (1.778 m)  Wt 199 lb (90.266 kg)  BMI 28.55 kg/m2  Physical Examination: General: Well developed, well nourished, NAD HEENT: OP clear, mucus membranes moist SKIN: warm, dry. No rashes. Neuro: No focal deficits Musculoskeletal: Muscle strength 5/5 all ext Psychiatric: Mood and affect normal Neck: No JVD, no carotid bruits, no thyromegaly, no lymphadenopathy. Lungs:Clear bilaterally, no wheezes, rhonci, crackles Cardiovascular: Regular rate and rhythm. No murmurs, gallops or rubs. Abdomen:Soft. Bowel sounds present. Non-tender.  Extremities: No lower extremity edema. Pulses are 2 + in the bilateral  DP/PT.  Cardiac cath 03/30/11: Left main: No evidence of disease.  Left Anterior Descending Artery: Large vessel that courses to the apex. The stent in the mid vessel is patent with minimal in-stent restenosis. Moderate sized diagonal branch with 50% proximal stenosis.  Circumflex Artery: Early intermediate branch with 40% proximal stenosis. The mid vessel has a 99% diffuse stenosis extending down into the first obtuse marginal branch. This is a small to moderate sized vessel. The AV groove Circumflex is very small in caliber beyond the takeoff of the OM branch.  Right Coronary Artery: Large dominant vessel with mild 205 plaque in the mid vessel.  Left Ventricular Angiogram: LVEF 60-65%. NO wall motion abormalities. No MR noted.  Impression:  1. Double vessel CAD  2. NSTEMI secondary to severe stenosis mid Circumflex/OM1.  3. Patent stent mid LAD  4. Successful PTCA/DES x2 (overlapping stents) mid Circumflex/OM1.  5. Preserved LV systolic function.  Recommendations: ASABrilinta for at least one year.  Complications: None. The patient tolerated the procedure well.   EKG: NSR, rate 84 bpm.   Assessment and Plan:   1. CAD: Stable. Continue ASA, statin and beta blocker. Based on findings in recent DAPT trial, will restart Plavix 75 mg po Qdaily.   2. HTN: BP controlled at home. No changes.   3. HLD: Lipids well controlled on statin. Will check lipids and LFTs today.

## 2013-05-28 NOTE — Patient Instructions (Signed)
Your physician wants you to follow-up in:  6 months.  You will receive a reminder letter in the mail two months in advance. If you don't receive a letter, please call our office to schedule the follow-up appointment.  Your physician has recommended you make the following change in your medication:  Start Clopidogrel 75 mg by mouth daily.   

## 2013-05-29 ENCOUNTER — Other Ambulatory Visit: Payer: Self-pay | Admitting: Cardiovascular Disease

## 2013-07-09 ENCOUNTER — Other Ambulatory Visit: Payer: Self-pay | Admitting: Cardiovascular Disease

## 2014-01-09 ENCOUNTER — Encounter (HOSPITAL_COMMUNITY): Payer: Self-pay | Admitting: Cardiovascular Disease

## 2014-02-03 ENCOUNTER — Other Ambulatory Visit: Payer: Self-pay | Admitting: Cardiovascular Disease

## 2014-02-06 ENCOUNTER — Encounter: Payer: Self-pay | Admitting: Cardiovascular Disease

## 2014-02-06 ENCOUNTER — Encounter (INDEPENDENT_AMBULATORY_CARE_PROVIDER_SITE_OTHER): Payer: Self-pay

## 2014-02-06 ENCOUNTER — Ambulatory Visit (INDEPENDENT_AMBULATORY_CARE_PROVIDER_SITE_OTHER): Payer: BLUE CROSS/BLUE SHIELD | Admitting: Cardiovascular Disease

## 2014-02-06 VITALS — BP 110/88 | HR 90 | Ht 70.0 in | Wt 208.0 lb

## 2014-02-06 DIAGNOSIS — I2510 Atherosclerotic heart disease of native coronary artery without angina pectoris: Secondary | ICD-10-CM

## 2014-02-06 DIAGNOSIS — E785 Hyperlipidemia, unspecified: Secondary | ICD-10-CM

## 2014-02-06 DIAGNOSIS — I10 Essential (primary) hypertension: Secondary | ICD-10-CM

## 2014-02-06 NOTE — Patient Instructions (Signed)
Your physician wants you to follow-up in:  12 months.  You will receive a reminder letter in the mail two months in advance. If you don't receive a letter, please call our office to schedule the follow-up appointment.   

## 2014-02-06 NOTE — Progress Notes (Signed)
History of Present Illness: 54 yo male with history of CAD, HTN, HLD who is here today for cardiac follow up. He has been followed in the past by Dr. Riley Kill. He presented in March 2009 with an anterior STEMI. A 2.75 x 28 mm bare metal stent was placed in the proximal LAD. He was readmitted February 2013 with unstable angina. Cardiac cath 03/30/11 with severe stenosis OM1 treated with overlapping DES (2.25 x 32 mm Promus Element DES, 2.25 x 16 mm Promus Element DES). There was a 50% diagonal stenosis, 20% RCA stenosis.   He is here today for follow up. He denies chest pain, SOB, palpitations. He is not exercising.   Primary Care Physician: Virl Son  Last Lipid Profile:Lipid Panel     Component Value Date/Time   CHOL 183 05/28/2013 0913   TRIG 231.0* 05/28/2013 0913   HDL 56.30 05/28/2013 0913   CHOLHDL 3 05/28/2013 0913   VLDL 46.2* 05/28/2013 0913   LDLCALC 81 05/28/2013 0913    Past Medical History  Diagnosis Date  . Coronary artery disease     a. 04/2007 - Ant Stemi: LM: nl, LAD: 100p ( 2.75 x 28 Vision BMS ), LCX: 70-75, RI: 40/50, RCA: 54m, EF 35-40%.;  b. 01/2011 - Abnl ETT (Inf-Lat ST dep);  c.  02/2011 - Ex MV:  Dist ant-sept Infarct.  No ischemia.;  d. 03/30/11 NSTEMI - Cath: 99 LCX into OM1, otw nonobs dzs & NL LV - LCX stented w/ 2 promus DES  . Hypertension   . Hypercholesteremia   . Habitual alcohol use     a. at least a 12 pack/wk (03/30/2011)  . PUD (peptic ulcer disease)     in his 19s    Past Surgical History  Procedure Laterality Date  . None    . Left heart catheterization with coronary angiogram N/A 03/30/2011    Procedure: LEFT HEART CATHETERIZATION WITH CORONARY ANGIOGRAM;  Surgeon: Kathleene Hazel, MD;  Location: [facility] CATH LAB;  Service: Cardiovascular;  Laterality: N/A;    Current Outpatient Prescriptions  Medication Sig Dispense Refill  . aspirin 81 MG tablet Take 81 mg by mouth daily.    Marland Kitchen atorvastatin (LIPITOR) 80 MG tablet TAKE 1 TABLET BY  MOUTH DAILY 30 tablet 9  . b complex vitamins tablet Take 1 tablet by mouth as needed.    . Calcium-Magnesium-Vitamin D (CALCIUM MAGNESIUM PO) Take by mouth. Pt takes this occasionally    . carvedilol (COREG) 6.25 MG tablet TAKE 1 TABLET (6.25 MG TOTAL) BY MOUTH 2 (TWO) TIMES DAILY. 60 tablet 1  . clopidogrel (PLAVIX) 75 MG tablet Take 1 tablet (75 mg total) by mouth daily. 30 tablet 11  . FLAXSEED-EVE PRIM-BORAGE PO Take by mouth. Pt takes this daily by mouth    . Multiple Vitamin (MULTIVITAMIN) tablet Take 1 tablet by mouth daily.    Marland Kitchen NITROSTAT 0.4 MG SL tablet PLACE 1 TABLET UNDER THE TONGUE EVERY 5 (FIVE) MINUTES AS NEEDED FOR CHEST PAIN. 25 tablet 1  . ranitidine (ZANTAC) 150 MG capsule Take 150 mg by mouth daily. Take 75 mg twice daily by mouth    . Zinc 100 MG TABS Take 100 mg by mouth as needed.     No current facility-administered medications for this visit.    No Known Allergies  History   Social History  . Marital Status: Single    Spouse Name: N/A    Number of Children: 0  . Years of Education: N/A  Occupational History  . works Clinical biochemistcustomer service [employer]    Social History Main Topics  . Smoking status: Never Smoker   . Smokeless tobacco: Never Used  . Alcohol Use: 5.0 oz/week    10 drink(s) per week     Comment: states he has quit drinking alcohol  . Drug Use: No  . Sexual Activity: Not on file   Other Topics Concern  . Not on file   Social History Narrative   [place] in [place] himself.  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    Family History  Problem Relation Age of Onset  . Coronary artery disease Mother [id number]    alive @ 3177  . Coronary artery disease Father     had MI in his 2360's - died @ 1087  . Heart attack Brother 51    alive - 454    Review of Systems:  As stated in the HPI and otherwise negative.   BP 110/88 mmHg  Pulse 90  Ht 5\' 10"  (1.778 m)  Wt 208 lb (94.348 kg)  BMI 29.84 kg/m2  SpO2 98%  Physical Examination: General: Well  developed, well nourished, NAD HEENT: OP clear, mucus membranes moist SKIN: warm, dry. No rashes. Neuro: No focal deficits Musculoskeletal: Muscle strength 5/5 all ext Psychiatric: Mood and affect normal Neck: No JVD, no carotid bruits, no thyromegaly, no lymphadenopathy. Lungs:Clear bilaterally, no wheezes, rhonci, crackles Cardiovascular: Regular rate and rhythm. No murmurs, gallops or rubs. Abdomen:Soft. Bowel sounds present. Non-tender.  Extremities: No lower extremity edema. Pulses are 2 + in the bilateral DP/PT.  Cardiac cath 03/30/11: Left main: No evidence of disease.  Left Anterior Descending Artery: Large vessel that courses to the apex. The stent in the mid vessel is patent with minimal in-stent restenosis. Moderate sized diagonal branch with 50% proximal stenosis.  Circumflex Artery: Early intermediate branch with 40% proximal stenosis. The mid vessel has a 99% diffuse stenosis extending down into the first obtuse marginal branch. This is a small to moderate sized vessel. The AV groove Circumflex is very small in caliber beyond the takeoff of the OM branch.  Right Coronary Artery: Large dominant vessel with mild 205 plaque in the mid vessel.  Left Ventricular Angiogram: LVEF 60-65%. NO wall motion abormalities. No MR noted.  Impression:  1. Double vessel CAD  2. NSTEMI secondary to severe stenosis mid Circumflex/OM1.  3. Patent stent mid LAD  4. Successful PTCA/DES x2 (overlapping stents) mid Circumflex/OM1.  5. Preserved LV systolic function.  Recommendations: ASABrilinta for at least one year.  Complications: None. The patient tolerated the procedure well.   Assessment and Plan:   1. CAD: Stable. Continue ASA, Plavix, statin and beta blocker. We discussed a stress test for screening but he does not wish to do this.   2. HTN: BP controlled at home. No changes.   3. HLD: Lipids well controlled on statin.

## 2014-04-11 ENCOUNTER — Other Ambulatory Visit: Payer: Self-pay | Admitting: Cardiovascular Disease

## 2014-04-14 ENCOUNTER — Other Ambulatory Visit: Payer: Self-pay

## 2014-05-07 ENCOUNTER — Other Ambulatory Visit: Payer: Self-pay | Admitting: Cardiovascular Disease

## 2014-05-23 ENCOUNTER — Other Ambulatory Visit: Payer: Self-pay | Admitting: Cardiovascular Disease

## 2014-12-26 ENCOUNTER — Other Ambulatory Visit: Payer: Self-pay | Admitting: Cardiovascular Disease

## 2015-01-09 ENCOUNTER — Other Ambulatory Visit: Payer: Self-pay | Admitting: Cardiovascular Disease

## 2015-02-04 ENCOUNTER — Other Ambulatory Visit: Payer: Self-pay | Admitting: Cardiovascular Disease

## 2015-02-26 ENCOUNTER — Other Ambulatory Visit: Payer: Self-pay | Admitting: Cardiovascular Disease

## 2015-04-14 ENCOUNTER — Other Ambulatory Visit: Payer: Self-pay | Admitting: Cardiovascular Disease

## 2015-04-14 NOTE — Telephone Encounter (Signed)
Medication Detail      Disp Refills Start End     carvedilol (COREG) 6.25 MG tablet 60 tablet 11 04/14/2014     Sig: TAKE 1 TABLET (6.25 MG TOTAL) BY MOUTH 2 (TWO) TIMES DAILY.    E-Prescribing Status: Receipt confirmed by pharmacy (04/14/2014 8:41 AM EDT)     Pharmacy    [id number] - [place], [place] - [address]

## 2015-04-19 ENCOUNTER — Other Ambulatory Visit: Payer: Self-pay | Admitting: Cardiovascular Disease

## 2015-05-07 ENCOUNTER — Ambulatory Visit (INDEPENDENT_AMBULATORY_CARE_PROVIDER_SITE_OTHER): Payer: 59 | Admitting: Cardiovascular Disease

## 2015-05-07 ENCOUNTER — Encounter: Payer: Self-pay | Admitting: Cardiovascular Disease

## 2015-05-07 VITALS — BP 118/76 | HR 78 | Ht 71.0 in | Wt 211.2 lb

## 2015-05-07 DIAGNOSIS — E785 Hyperlipidemia, unspecified: Secondary | ICD-10-CM

## 2015-05-07 DIAGNOSIS — I2510 Atherosclerotic heart disease of native coronary artery without angina pectoris: Secondary | ICD-10-CM | POA: Diagnosis not present

## 2015-05-07 DIAGNOSIS — I10 Essential (primary) hypertension: Secondary | ICD-10-CM

## 2015-05-07 NOTE — Patient Instructions (Signed)

## 2015-05-07 NOTE — Progress Notes (Signed)
Chief Complaint  Patient presents with  . Follow-up   History of Present Illness: 55 yo male with history of CAD, HTN, HLD who is here today for cardiac follow up. He has been followed in the past by Dr. Riley Kill. He presented in March 2009 with an anterior STEMI. A 2.75 x 28 mm bare metal stent was placed in the proximal LAD. He was readmitted February 2013 with unstable angina. Cardiac cath 03/30/11 with severe stenosis OM1 treated with overlapping DES (2.25 x 32 mm Promus Element DES, 2.25 x 16 mm Promus Element DES). There was a 50% diagonal stenosis, 20% RCA stenosis.   He is here today for follow up. He denies chest pain, SOB, palpitations. He is not exercising.   Primary Care Physician: Virl Son  Past Medical History  Diagnosis Date  . Coronary artery disease     a. 04/2007 - Ant Stemi: LM: nl, LAD: 100p ( 2.75 x 28 Vision BMS ), LCX: 70-75, RI: 40/50, RCA: 47m, EF 35-40%.;  b. 01/2011 - Abnl ETT (Inf-Lat ST dep);  c.  02/2011 - Ex MV:  Dist ant-sept Infarct.  No ischemia.;  d. 03/30/11 NSTEMI - Cath: 99 LCX into OM1, otw nonobs dzs & NL LV - LCX stented w/ 2 promus DES  . Hypertension   . Hypercholesteremia   . Habitual alcohol use     a. at least a 12 pack/wk (03/30/2011)  . PUD (peptic ulcer disease)     in his 71s    Past Surgical History  Procedure Laterality Date  . None    . Left heart catheterization with coronary angiogram N/A 03/30/2011    Procedure: LEFT HEART CATHETERIZATION WITH CORONARY ANGIOGRAM;  Surgeon: Kathleene Hazel, MD;  Location: [facility] C[facility] LAB;  Service: Cardiovascular;  Laterality: N/A;    Current Outpatient Prescriptions  Medication Sig Dispense Refill  . aspirin 81 MG tablet Take 81 mg by mouth daily.    Marland Kitchen atorvastatin (LIPITOR) 80 MG tablet TAKE 1 TABLET BY MOUTH EVERY DAY 30 tablet 2  . b complex vitamins tablet Take 1 tablet by mouth as needed.    . Calcium-Magnesium-Vitamin D (CALCIUM MAGNESIUM PO) Take by mouth. Pt takes this occasionally      . carvedilol (COREG) 6.25 MG tablet TAKE 1 TABLET (6.25 MG TOTAL) BY MOUTH 2 (TWO) TIMES DAILY. 60 tablet 0  . clopidogrel (PLAVIX) 75 MG tablet TAKE 1 TABLET BY MOUTH DAILY 30 tablet 4  . FLAXSEED-EVE PRIM-BORAGE PO Take by mouth. Pt takes this daily by mouth    . Multiple Vitamin (MULTIVITAMIN) tablet Take 1 tablet by mouth daily.    Marland Kitchen NITROSTAT 0.4 MG SL tablet PLACE 1 TABLET UNDER THE TONGUE EVERY 5 (FIVE) MINUTES AS NEEDED FOR CHEST PAIN. 25 tablet 1  . ranitidine (ZANTAC) 150 MG capsule Take 150 mg by mouth daily. Take 75 mg twice daily by mouth    . Zinc 100 MG TABS Take 30 mg by mouth as needed.      No current facility-administered medications for this visit.    No Known Allergies  Social History   Social History  . Marital Status: Single    Spouse Name: N/A  . Number of Children: 0  . Years of Education: N/A   Occupational History  . 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 Smoking status: Never Smoker   . Smokeless tobacco: Never Used  . Alcohol Use: 5.0 oz/week    10  Standard drinks or equivalent per week     Comment: states he has quit drinking alcohol  . Drug Use: No  . Sexual Activity: Not on file   Other Topics Concern  . Not on file   Social History Narrative   [place] in [place] by himself.  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    Family History  Problem Relation Age of Onset  . Coronary artery disease Mother 670    alive @ 3077  . Coronary artery disease Father     had MI in his 5760's - died @ 4687  . Heart attack Brother 51    alive - 2054    Review of Systems:  As stated in the HPI and otherwise negative.   BP 118/76 mmHg  Pulse 78  Ht 5\' 11"  (1.803 m)  Wt 211 lb 3.2 oz (95.8 kg)  BMI 29.47 kg/m2  SpO2 98%  Physical Examination: General: Well developed, well nourished, NAD HEENT: OP clear, mucus membranes moist SKIN: warm, dry. No rashes. Neuro: No focal deficits Musculoskeletal: Muscle strength 5/5 all  ext Psychiatric: Mood and affect normal Neck: No JVD, no carotid bruits, no thyromegaly, no lymphadenopathy. Lungs:Clear bilaterally, no wheezes, rhonci, crackles Cardiovascular: Regular rate and rhythm. No murmurs, gallops or rubs. Abdomen:Soft. Bowel sounds present. Non-tender.  Extremities: No lower extremity edema. Pulses are 2 + in the bilateral DP/PT.  Cardiac cath 03/30/11: Left main: No evidence of disease.  Left Anterior Descending Artery: Large vessel that courses to the apex. The stent in the mid vessel is patent with minimal in-stent restenosis. Moderate sized diagonal branch with 50% proximal stenosis.  Circumflex Artery: Early intermediate branch with 40% proximal stenosis. The mid vessel has a 99% diffuse stenosis extending down into the first obtuse marginal branch. This is a small to moderate sized vessel. The AV groove Circumflex is very small in caliber beyond the takeoff of the OM branch.  Right Coronary Artery: Large dominant vessel with mild 205 plaque in the mid vessel.  Left Ventricular Angiogram: LVEF 60-65%. NO wall motion abormalities. No MR noted.  Impression:  1. Double vessel CAD  2. NSTEMI secondary to severe stenosis mid Circumflex/OM1.  3. Patent stent mid LAD  4. Successful PTCA/DES x2 (overlapping stents) mid Circumflex/OM1.  5. Preserved LV systolic function.  Recommendations: ASABrilinta for at least one year.  Complications: None. The patient tolerated the procedure well.   EKG:  EKG is ordered today. The ekg ordered today demonstrates NSR, rate 78 bpm  Recent Labs: No results found for requested labs within last 365 days.   Lipid Panel    Component Value Date/Time   CHOL 183 05/28/2013 0913   TRIG 231.0* 05/28/2013 0913   HDL 56.30 05/28/2013 0913   CHOLHDL 3 05/28/2013 0913   VLDL 46.2* 05/28/2013 0913   LDLCALC 81 05/28/2013 0913     Wt Readings from Last 3 Encounters:  05/07/15 211 lb 3.2 oz (95.8 kg)  02/06/14 208 lb (94.348 kg)   05/28/13 199 lb (90.266 kg)     Other studies Reviewed: Additional studies/ records that were reviewed today include: . Review of the above records demonstrates:    Assessment and Plan:   1. CAD: Stable. Continue ASA, Plavix, statin and beta blocker.   2. HTN: BP controlled. No changes.   3. HLD: Lipids well controlled per pt (LDL 69) in primary care. Continue statin.    Current medicines are reviewed at length with the patient today.  The patient  does not have concerns regarding medicines.  The following changes have been made:  no change  Labs/ tests ordered today include:   Orders Placed This Encounter  Procedures  . EKG 12-Lead    Disposition:   FU with me in 12  months  Signed, Verne Carrow, MD 05/07/2015 4:50 PM    [facility] [address] [place], [place], [place]  [address] Phone: [phone]; Fax: [phone]

## 2015-05-16 ENCOUNTER — Other Ambulatory Visit: Payer: Self-pay | Admitting: Cardiovascular Disease

## 2015-07-31 ENCOUNTER — Other Ambulatory Visit: Payer: Self-pay | Admitting: Cardiovascular Disease

## 2016-04-21 ENCOUNTER — Other Ambulatory Visit: Payer: Self-pay | Admitting: Cardiovascular Disease

## 2016-05-19 ENCOUNTER — Other Ambulatory Visit: Payer: Self-pay | Admitting: Cardiovascular Disease

## 2016-06-01 ENCOUNTER — Other Ambulatory Visit: Payer: Self-pay | Admitting: Cardiovascular Disease

## 2016-06-15 ENCOUNTER — Other Ambulatory Visit: Payer: Self-pay | Admitting: *Deleted

## 2016-06-15 MED ORDER — ATORVASTATIN CALCIUM 80 MG PO TABS: 80.0000 mg | ORAL_TABLET | Freq: Every day | ORAL | 1 refills | Status: DC

## 2016-06-15 MED ORDER — CLOPIDOGREL BISULFATE 75 MG PO TABS: 75.0000 mg | ORAL_TABLET | Freq: Every day | ORAL | 1 refills | Status: DC

## 2016-06-15 MED ORDER — CARVEDILOL 6.25 MG PO TABS: 6.2500 mg | ORAL_TABLET | Freq: Two times a day (BID) | ORAL | 1 refills | Status: DC

## 2016-08-01 ENCOUNTER — Other Ambulatory Visit: Payer: Self-pay | Admitting: Cardiovascular Disease

## 2016-08-01 MED ORDER — CLOPIDOGREL BISULFATE 75 MG PO TABS: 75.0000 mg | ORAL_TABLET | Freq: Every day | ORAL | 0 refills | Status: DC

## 2016-08-10 ENCOUNTER — Ambulatory Visit (INDEPENDENT_AMBULATORY_CARE_PROVIDER_SITE_OTHER): Payer: 59 | Admitting: Cardiovascular Disease

## 2016-08-10 ENCOUNTER — Encounter: Payer: Self-pay | Admitting: Cardiovascular Disease

## 2016-08-10 ENCOUNTER — Encounter (INDEPENDENT_AMBULATORY_CARE_PROVIDER_SITE_OTHER): Payer: Self-pay

## 2016-08-10 VITALS — Ht 71.0 in

## 2016-08-10 DIAGNOSIS — I2510 Atherosclerotic heart disease of native coronary artery without angina pectoris: Secondary | ICD-10-CM | POA: Diagnosis not present

## 2016-08-10 DIAGNOSIS — I10 Essential (primary) hypertension: Secondary | ICD-10-CM | POA: Diagnosis not present

## 2016-08-10 DIAGNOSIS — E7800 Pure hypercholesterolemia, unspecified: Secondary | ICD-10-CM | POA: Diagnosis not present

## 2016-08-10 NOTE — Progress Notes (Signed)
Chief Complaint  Patient presents with  . Follow-up    CAD   History of Present Illness: 56 yo male with history of CAD, HTN, HLD who is here today for cardiac follow up. He presented in March 2009 with an anterior STEMI. A 2.75 x 28 mm bare metal stent was placed in the proximal LAD. He was readmitted February 2013 with unstable angina. Cardiac cath 03/30/11 with severe stenosis OM1 treated with overlapping DES (2.25 x 32 mm Promus Element DES, 2.25 x 16 mm Promus Element DES). There was a 50% diagonal stenosis, 20% RCA stenosis.   He is here today for follow up. The patient denies any chest pain, dyspnea, palpitations, lower extremity edema, orthopnea, PND, dizziness, near syncope or syncope.    Primary Care Physician: Verlon Au, MD   Past Medical History:  Diagnosis Date  . Coronary artery disease    a. 04/2007 - Ant Stemi: LM: nl, LAD: 100p ( 2.75 x 28 Vision BMS ), LCX: 70-75, RI: 40/50, RCA: 62m, EF 35-40%.;  b. 01/2011 - Abnl ETT (Inf-Lat ST dep);  c.  02/2011 - Ex MV:  Dist ant-sept Infarct.  No ischemia.;  d. 03/30/11 NSTEMI - Cath: 99 LCX into OM1, otw nonobs dzs & NL LV - LCX stented w/ 2 promus DES  . Habitual alcohol use    a. at least a 12 pack/wk (03/30/2011)  . Hypercholesteremia   . Hypertension   . PUD (peptic ulcer disease)    in his 11s    Past Surgical History:  Procedure Laterality Date  . LEFT HEART CATHETERIZATION WITH CORONARY ANGIOGRAM N/A 03/30/2011   Procedure: LEFT HEART CATHETERIZATION WITH CORONARY ANGIOGRAM;  Surgeon: Kathleene Hazel, MD;  Location: [facility] CATH LAB;  Service: Cardiovascular;  Laterality: N/A;  . None      Current Outpatient Prescriptions  Medication Sig Dispense Refill  . aspirin 81 MG tablet Take 81 mg by mouth daily.    Marland Kitchen atorvastatin (LIPITOR) 80 MG tablet Take 1 tablet (80 mg total) by mouth daily. 30 tablet 1  . b complex vitamins tablet Take 1 tablet by mouth as needed.    . Calcium-Magnesium-Vitamin D (CALCIUM  MAGNESIUM PO) Take by mouth. Pt takes this occasionally    . carvedilol (COREG) 6.25 MG tablet Take 1 tablet (6.25 mg total) by mouth 2 (two) times daily. *Patient needs to keep 08/10/16 appointment for further refills* 60 tablet 1  . clopidogrel (PLAVIX) 75 MG tablet Take 1 tablet (75 mg total) by mouth daily. *Please keep 08/10/16 appointment for further refills* 90 tablet 0  . FLAXSEED-EVE PRIM-BORAGE PO Take by mouth. Pt takes this daily by mouth    . Multiple Vitamin (MULTIVITAMIN) tablet Take 1 tablet by mouth daily.    Marland Kitchen NITROSTAT 0.4 MG SL tablet PLACE 1 TABLET UNDER THE TONGUE EVERY 5 (FIVE) MINUTES AS NEEDED FOR CHEST PAIN. 25 tablet 1  . ranitidine (ZANTAC) 150 MG capsule Take 150 mg by mouth daily. Take 75 mg twice daily by mouth    . Zinc 100 MG TABS Take 30 mg by mouth as needed.      No current facility-administered medications for this visit.     No Known Allergies  Social History   Social History  . Marital status: Single    Spouse name: N/A  . Number of children: 0  . Years of education: N/A   Occupational History  . 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  Smoking status: Never Smoker  . Smokeless tobacco: Never Used  . Alcohol use 5.0 oz/week    10 Standard drinks or equivalent per week     Comment: states he has quit drinking alcohol  . Drug use: No  . Sexual activity: Not on file   Other Topics Concern  . Not on file   Social History Narrative   Lives in [place] by himself.  Works locally as a Stage manager.    Family History  Problem Relation Age of Onset  . Coronary artery disease Mother 29       alive @ 83  . Coronary artery disease Father        had MI in his 27's - died @ 29  . Heart attack Brother 51       alive - 12    Review of Systems:  As stated in the HPI and otherwise negative.   Ht 5\' 11"  (1.803 m)   Physical Examination:  General: Well developed, well nourished, NAD  HEENT: OP clear, mucus  membranes moist  SKIN: warm, dry. No rashes. Neuro: No focal deficits  Musculoskeletal: Muscle strength 5/5 all ext  Psychiatric: Mood and affect normal  Neck: No JVD, no carotid bruits, no thyromegaly, no lymphadenopathy.  Lungs:Clear bilaterally, no wheezes, rhonci, crackles Cardiovascular: Regular rate and rhythm. No murmurs, gallops or rubs. Abdomen:Soft. Bowel sounds present. Non-tender.  Extremities: No lower extremity edema. Pulses are 2 + in the bilateral DP/PT.  Cardiac cath 03/30/11: Left main: No evidence of disease.  Left Anterior Descending Artery: Large vessel that courses to the apex. The stent in the mid vessel is patent with minimal in-stent restenosis. Moderate sized diagonal branch with 50% proximal stenosis.  Circumflex Artery: Early intermediate branch with 40% proximal stenosis. The mid vessel has a 99% diffuse stenosis extending down into the first obtuse marginal branch. This is a small to moderate sized vessel. The AV groove Circumflex is very small in caliber beyond the takeoff of the OM branch.  Right Coronary Artery: Large dominant vessel with mild 205 plaque in the mid vessel.  Left Ventricular Angiogram: LVEF 60-65%. NO wall motion abormalities. No MR noted.  Impression:  1. Double vessel CAD  2. NSTEMI secondary to severe stenosis mid Circumflex/OM1.  3. Patent stent mid LAD  4. Successful PTCA/DES x2 (overlapping stents) mid Circumflex/OM1.  5. Preserved LV systolic function.  Recommendations: ASABrilinta for at least one year.  Complications: None. The patient tolerated the procedure well.   EKG:  EKG is  ordered today. The ekg ordered today demonstrates NSR, rate 89 bpm.   Recent Labs: No results found for requested labs within last 8760 hours.   Lipid Panel    Component Value Date/Time   CHOL 183 05/28/2013 0913   TRIG 231.0 (H) 05/28/2013 0913   HDL 56.30 05/28/2013 0913   CHOLHDL 3 05/28/2013 0913   VLDL 46.2 (H) 05/28/2013 0913   LDLCALC 81  05/28/2013 0913     Wt Readings from Last 3 Encounters:  05/07/15 211 lb 3.2 oz (95.8 kg)  02/06/14 208 lb (94.3 kg)  05/28/13 199 lb (90.3 kg)     Other studies Reviewed: Additional studies/ records that were reviewed today include: . Review of the above records demonstrates:    Assessment and Plan:   1. CAD without angina: He is having no chest pain suggestive of angina. Will continue ASA, Plavix, statin and beta blocker.    2. HTN: BP is controlled. No changes  today.   3. HLD: Lipids are controlled in primary care. LDL 67 December 2017. Continue statin.   Current medicines are reviewed at length with the patient today.  The patient does not have concerns regarding medicines.  The following changes have been made:  no change  Labs/ tests ordered today include:   No orders of the defined types were placed in this encounter.   Disposition:   FU with me in 12  months  Signed, Verne Carrowhristopher Auron Tadros, MD 08/10/2016 4:37 PM    [facility] [address], [place], [place]  [address] Phone: [phone]; Fax: [phone]

## 2016-08-10 NOTE — Patient Instructions (Signed)

## 2016-09-01 ENCOUNTER — Other Ambulatory Visit: Payer: Self-pay | Admitting: Cardiovascular Disease

## 2016-11-01 ENCOUNTER — Other Ambulatory Visit: Payer: Self-pay | Admitting: *Deleted

## 2016-11-01 MED ORDER — CLOPIDOGREL BISULFATE 75 MG PO TABS: 75.0000 mg | ORAL_TABLET | Freq: Every day | ORAL | 2 refills | Status: DC

## 2017-07-07 ENCOUNTER — Other Ambulatory Visit: Payer: Self-pay | Admitting: Cardiovascular Disease

## 2017-08-04 ENCOUNTER — Other Ambulatory Visit: Payer: Self-pay | Admitting: Cardiovascular Disease

## 2017-10-01 ENCOUNTER — Other Ambulatory Visit: Payer: Self-pay | Admitting: Cardiovascular Disease

## 2017-10-12 ENCOUNTER — Encounter: Payer: Self-pay | Admitting: Cardiovascular Disease

## 2017-10-25 ENCOUNTER — Ambulatory Visit: Payer: 59 | Admitting: Cardiovascular Disease

## 2017-10-25 ENCOUNTER — Encounter: Payer: Self-pay | Admitting: Cardiovascular Disease

## 2017-10-25 VITALS — BP 110/84 | HR 75 | Ht 71.0 in | Wt 205.8 lb

## 2017-10-25 DIAGNOSIS — I2510 Atherosclerotic heart disease of native coronary artery without angina pectoris: Secondary | ICD-10-CM

## 2017-10-25 DIAGNOSIS — E7800 Pure hypercholesterolemia, unspecified: Secondary | ICD-10-CM

## 2017-10-25 DIAGNOSIS — I10 Essential (primary) hypertension: Secondary | ICD-10-CM

## 2017-10-25 MED ORDER — NITROGLYCERIN 0.4 MG SL SUBL: SUBLINGUAL_TABLET | SUBLINGUAL | 6 refills | Status: DC

## 2017-10-25 NOTE — Progress Notes (Signed)
Chief Complaint  Patient presents with  . Follow-up    CAD   History of Present Illness: 57 yo male with history of CAD, HTN and HLD who is here today for cardiac follow up. He presented in March 2009 with an anterior STEMI. A 2.75 x 28 mm bare metal stent was placed in the proximal LAD. He was readmitted February 2013 with unstable angina. Cardiac cath 03/30/11 with severe stenosis OM1 treated with overlapping drug eluting stents (2.25 x 32 mm Promus Element DES, 2.25 x 16 mm Promus Element DES). There was a 50% diagonal stenosis, 20% RCA stenosis at that time.  He is here today for follow up. The patient denies any chest pain, dyspnea, palpitations, lower extremity edema, orthopnea, PND, dizziness, near syncope or syncope.   Primary Care Physician: Verlon Au, MD  Past Medical History:  Diagnosis Date  . Coronary artery disease    a. 04/2007 - Ant Stemi: LM: nl, LAD: 100p ( 2.75 x 28 Vision BMS ), LCX: 70-75, RI: 40/50, RCA: 85m, EF 35-40%.;  b. 01/2011 - Abnl ETT (Inf-Lat ST dep);  c.  02/2011 - Ex MV:  Dist ant-sept Infarct.  No ischemia.;  d. 03/30/11 NSTEMI - Cath: 99 LCX into OM1, otw nonobs dzs & NL LV - LCX stented w/ 2 promus DES  . Habitual alcohol use    a. at least a 12 pack/wk (03/30/2011)  . Hypercholesteremia   . Hypertension   . PUD (peptic ulcer disease)    in his 58s    Past Surgical History:  Procedure Laterality Date  . LEFT HEART CATHETERIZATION WITH CORONARY ANGIOGRAM N/A 03/30/2011   Procedure: LEFT HEART CATHETERIZATION WITH CORONARY ANGIOGRAM;  Surgeon: Kathleene Hazel, MD;  Location: [facility] CATH LAB;  Service: Cardiovascular;  Laterality: N/A;  . None      Current Outpatient Medications  Medication Sig Dispense Refill  . aspirin 81 MG tablet Take 81 mg by mouth daily.    Marland Kitchen atorvastatin (LIPITOR) 80 MG tablet Take 1 tablet (80 mg total) by mouth daily. Patient must keep upcoming appointment for further refills 30 tablet 0  . b complex vitamins  tablet Take 1 tablet by mouth as needed.    . Calcium-Magnesium-Vitamin D (CALCIUM MAGNESIUM PO) Take by mouth. Pt takes this occasionally    . carvedilol (COREG) 6.25 MG tablet TAKE 1 TABLET (6.25 MG TOTAL) BY MOUTH 2 (TWO) TIMES DAILY WITH A MEAL. 60 tablet 1  . clopidogrel (PLAVIX) 75 MG tablet TAKE 1 TABLET BY MOUTH EVERY DAY 30 tablet 0  . FLAXSEED-EVE PRIM-BORAGE PO Take by mouth. Pt takes this daily by mouth    . Multiple Vitamin (MULTIVITAMIN) tablet Take 1 tablet by mouth daily.    Marland Kitchen NITROSTAT 0.4 MG SL tablet PLACE 1 TABLET UNDER THE TONGUE EVERY 5 (FIVE) MINUTES AS NEEDED FOR CHEST PAIN. 25 tablet 1  . ranitidine (ZANTAC) 150 MG capsule Take 150 mg by mouth daily. Take 75 mg twice daily by mouth    . Zinc 100 MG TABS Take 30 mg by mouth as needed.      No current facility-administered medications for this visit.     No Known Allergies  Social History   Socioeconomic History  . Marital status: Single    Spouse name: Not on file  . Number of children: 0  . Years of education: Not on file  . Highest education level: Not on file  Occupational History  . Occupation: works Clinical biochemist  [employer]  Social Needs  . Financial resource strain: Not on file  . Food insecurity:    Worry: Not on file    Inability: Not on file  . Transportation needs:    Medical: Not on file    Non-medical: Not on file  Tobacco Use  . Smoking status: Never Smoker  . Smokeless tobacco: Never Used  Substance and Sexual Activity  . Alcohol use: Yes    Alcohol/week: 10.0 standard drinks    Types: 10 Standard drinks or equivalent per week    Comment: states he has quit drinking alcohol  . Drug use: No  . Sexual activity: Not on file  Lifestyle  . Physical activity:    Days per week: Not on file    Minutes per session: Not on file  . Stress: Not on file  Relationships  . Social connections:    Talks on phone: Not on file    Gets together: Not on file    Attends religious service: Not on file      Active member of club or organization: Not on file    Attends meetings of clubs or organizations: Not on file    Relationship status: Not on file  . Intimate partner violence:    Fear of current or ex partner: Not on file    Emotionally abused: Not on file    Physically abused: Not on file    Forced sexual activity: Not on file  Other Topics Concern  . Not on file  Social History Narrative   Lives in [place] by himself.  Works locally as a Stage manager.    Family History  Problem Relation Age of Onset  . Coronary artery disease Mother 79       alive @ 64  . Coronary artery disease Father        had MI in his 64's - died @ 47  . Heart attack Brother 51       alive - 49    Review of Systems:  As stated in the HPI and otherwise negative.   BP 110/84   Pulse 75   Ht 5\' 11"  (1.803 m)   Wt 205 lb 12.8 oz (93.4 kg)   SpO2 98%   BMI 28.70 kg/m   Physical Examination:  General: Well developed, well nourished, NAD  HEENT: OP clear, mucus membranes moist  SKIN: warm, dry. No rashes. Neuro: No focal deficits  Musculoskeletal: Muscle strength 5/5 all ext  Psychiatric: Mood and affect normal  Neck: No JVD, no carotid bruits, no thyromegaly, no lymphadenopathy.  Lungs:Clear bilaterally, no wheezes, rhonci, crackles Cardiovascular: Regular rate and rhythm. No murmurs, gallops or rubs. Abdomen:Soft. Bowel sounds present. Non-tender.  Extremities: No lower extremity edema. Pulses are 2 + in the bilateral DP/PT.  Cardiac cath 03/30/11: Left main: No evidence of disease.  Left Anterior Descending Artery: Large vessel that courses to the apex. The stent in the mid vessel is patent with minimal in-stent restenosis. Moderate sized diagonal branch with 50% proximal stenosis.  Circumflex Artery: Early intermediate branch with 40% proximal stenosis. The mid vessel has a 99% diffuse stenosis extending down into the first obtuse marginal branch. This is a small to moderate sized  vessel. The AV groove Circumflex is very small in caliber beyond the takeoff of the OM branch.  Right Coronary Artery: Large dominant vessel with mild 205 plaque in the mid vessel.  Left Ventricular Angiogram: LVEF 60-65%. NO wall motion abormalities. No MR noted.  Impression:  1. Double vessel CAD  2. NSTEMI secondary to severe stenosis mid Circumflex/OM1.  3. Patent stent mid LAD  4. Successful PTCA/DES x2 (overlapping stents) mid Circumflex/OM1.  5. Preserved LV systolic function.  Recommendations: ASABrilinta for at least one year.  Complications: None. The patient tolerated the procedure well.   EKG:  EKG is  ordered today. The ekg ordered today demonstrates NSR, rate 75 bpm.   Recent Labs: No results found for requested labs within last 8760 hours.   Lipid Panel Followed in primary care   Wt Readings from Last 3 Encounters:  10/25/17 205 lb 12.8 oz (93.4 kg)  05/07/15 211 lb 3.2 oz (95.8 kg)  02/06/14 208 lb (94.3 kg)     Other studies Reviewed: Additional studies/ records that were reviewed today include: . Review of the above records demonstrates:    Assessment and Plan:   1. CAD without angina: No chest pain. Will continue ASA, Plavix, statin and beta blocker.    2. HTN: BP is well controlled. No changes.   3. HLD: Lipids are followed in primary care. LDL 73 in December 2018. Will continue statin.    Current medicines are reviewed at length with the patient today.  The patient does not have concerns regarding medicines.  The following changes have been made:  no change  Labs/ tests ordered today include:   No orders of the defined types were placed in this encounter.   Disposition:   FU with me in 12  months  Signed, Verne Carrow, MD 10/25/2017 3:59 PM    [facility] [address] [place], [place], [place]  [address] Phone: [phone]; Fax: [phone]

## 2017-10-25 NOTE — Patient Instructions (Signed)

## 2017-10-25 NOTE — Addendum Note (Signed)
Addended by: Dossie Arbour on: 10/25/2017 04:08 PM   Modules accepted: Orders

## 2017-10-29 ENCOUNTER — Other Ambulatory Visit: Payer: Self-pay | Admitting: Cardiovascular Disease

## 2017-10-31 ENCOUNTER — Other Ambulatory Visit: Payer: Self-pay | Admitting: Cardiovascular Disease

## 2017-11-28 ENCOUNTER — Other Ambulatory Visit: Payer: Self-pay | Admitting: Cardiovascular Disease

## 2018-08-29 ENCOUNTER — Other Ambulatory Visit: Payer: Self-pay | Admitting: Cardiovascular Disease

## 2018-09-24 ENCOUNTER — Other Ambulatory Visit: Payer: Self-pay | Admitting: Cardiovascular Disease

## 2018-11-21 ENCOUNTER — Other Ambulatory Visit: Payer: Self-pay | Admitting: Cardiovascular Disease

## 2018-12-14 ENCOUNTER — Other Ambulatory Visit: Payer: Self-pay | Admitting: Cardiovascular Disease

## 2018-12-17 ENCOUNTER — Other Ambulatory Visit: Payer: Self-pay | Admitting: Cardiovascular Disease

## 2018-12-19 ENCOUNTER — Encounter: Payer: Self-pay | Admitting: Cardiovascular Disease

## 2018-12-19 ENCOUNTER — Ambulatory Visit: Payer: 59 | Admitting: Cardiovascular Disease

## 2018-12-19 ENCOUNTER — Other Ambulatory Visit: Payer: Self-pay

## 2018-12-19 VITALS — BP 108/82 | HR 82 | Ht 70.0 in | Wt 211.2 lb

## 2018-12-19 DIAGNOSIS — I2510 Atherosclerotic heart disease of native coronary artery without angina pectoris: Secondary | ICD-10-CM | POA: Diagnosis not present

## 2018-12-19 DIAGNOSIS — E7800 Pure hypercholesterolemia, unspecified: Secondary | ICD-10-CM

## 2018-12-19 DIAGNOSIS — I10 Essential (primary) hypertension: Secondary | ICD-10-CM | POA: Diagnosis not present

## 2018-12-19 MED ORDER — NITROGLYCERIN 0.4 MG SL SUBL: SUBLINGUAL_TABLET | SUBLINGUAL | 3 refills | Status: DC

## 2018-12-19 NOTE — Addendum Note (Signed)
Addended by: Rodman Key on: 12/19/2018 04:44 PM   Modules accepted: Orders

## 2018-12-19 NOTE — Patient Instructions (Signed)
Medication Instructions:  No changes *If you need a refill on your cardiac medications before your next appointment, please call your pharmacy*  Lab Work: None If you have labs (blood work) drawn today and your tests are completely normal, you will receive your results only by: Marland Kitchen MyChart Message (if you have MyChart) OR . A paper copy in the mail If you have any lab test that is abnormal or we need to change your treatment, we will call you to review the results.  Testing/Procedures: Your physician has requested that you have an exercise tolerance test. For further information please visit [email or web address]. Please also follow instruction sheet, as given.  Your physician has requested that you have an echocardiogram. Echocardiography is a painless test that uses sound waves to create images of your heart. It provides your doctor with information about the size and shape of your heart and how well your heart's chambers and valves are working. This procedure takes approximately one hour. There are no restrictions for this procedure.   Follow-Up: At [facility], you and your health needs are our priority.  As part of our continuing mission to provide you with exceptional heart care, we have created designated Provider Care Teams.  These Care Teams include your primary Cardiologist (physician) and Advanced Practice Providers (APPs -  Physician Assistants and Nurse Practitioners) who all work together to provide you with the care you need, when you need it.  Your next appointment:   6 month(s)  The format for your next appointment:   Either In Person or Virtual  Provider:   Lauree Chandler, MD  Other Instructions

## 2018-12-19 NOTE — H&P (View-Only) (Signed)
Chief Complaint  Patient presents with  . Follow-up    CAD   History of Present Illness: 58 yo male with history of CAD, HTN and HLD who is here today for cardiac follow up. He presented in March 2009 with an anterior STEMI. A 2.75 x 28 mm bare metal stent was placed in the proximal LAD. He was readmitted February 2013 with unstable angina. Cardiac cath 03/30/11 with severe stenosis OM1 treated with overlapping drug eluting stents (2.25 x 32 mm Promus Element DES, 2.25 x 16 mm Promus Element DES). There was a 50% diagonal stenosis, 20% RCA stenosis at that time.  He is here today for follow up. The patient denies any dyspnea, lower extremity edema, orthopnea, PND, dizziness, near syncope or syncope. He has had chest pains after meals. This is not similar to his anginal pain prior to his stents. He is feeling heaviness at rest that feels like a heavy beat. No irregularity of his heart rhythm.No exertional chest pain or associated dyspnea or nausea.   Primary Care Physician: Verlon AuBoyd, Tammy Lamonica, MD  Past Medical History:  Diagnosis Date  . Coronary artery disease    a. 04/2007 - Ant Stemi: LM: nl, LAD: 100p ( 2.75 x 28 Vision BMS ), LCX: 70-75, RI: 40/50, RCA: 93107m, EF 35-40%.;  b. 01/2011 - Abnl ETT (Inf-Lat ST dep);  c.  02/2011 - Ex MV:  Dist ant-sept Infarct.  No ischemia.;  d. 03/30/11 NSTEMI - Cath: 99 LCX into OM1, otw nonobs dzs & NL LV - LCX stented w/ 2 promus DES  . Habitual alcohol use    a. at least a 12 pack/wk (03/30/2011)  . Hypercholesteremia   . Hypertension   . PUD (peptic ulcer disease)    in his 5120s    Past Surgical History:  Procedure Laterality Date  . LEFT HEART CATHETERIZATION WITH CORONARY ANGIOGRAM N/A 03/30/2011   Procedure: LEFT HEART CATHETERIZATION WITH CORONARY ANGIOGRAM;  Surgeon: Kathleene Hazelhristopher D Alvey Brockel, MD;  [facility];  Service: Cardiovascular;  Laterality: N/A;  . None      Current Outpatient Medications  Medication Sig Dispense Refill  .  aspirin 81 MG tablet Take 81 mg by mouth daily.    Marland Kitchen. atorvastatin (LIPITOR) 80 MG tablet TAKE 1 TABLET BY MOUTH EVERY DAY 90 tablet 0  . b complex vitamins tablet Take 1 tablet by mouth as needed.    . Calcium-Magnesium-Vitamin D (CALCIUM MAGNESIUM PO) Take by mouth. Pt takes this occasionally    . carvedilol (COREG) 6.25 MG tablet TAKE 1 TABLET (6.25 MG TOTAL) BY MOUTH 2 (TWO) TIMES DAILY WITH A MEAL. 180 tablet 1  . clopidogrel (PLAVIX) 75 MG tablet Take 1 tablet (75 mg total) by mouth daily. 90 tablet 0  . Coenzyme Q10 (COQ10 PO) Take 100 mg by mouth daily.    . famotidine (PEPCID) 20 MG tablet Take 20 mg by mouth daily.    Marland Kitchen. FLAXSEED-EVE PRIM-BORAGE PO Take by mouth. Pt takes this daily by mouth    . Multiple Vitamin (MULTIVITAMIN) tablet Take 1 tablet by mouth daily.    . nitroGLYCERIN (NITROSTAT) 0.4 MG SL tablet PLACE 1 TABLET UNDER THE TONGUE EVERY 5 (FIVE) MINUTES AS NEEDED FOR CHEST PAIN. 25 tablet 6  . Zinc 100 MG TABS Take 30 mg by mouth as needed.      No current facility-administered medications for this visit.     No Known Allergies  Social History   Socioeconomic History  .  Marital status: Single    Spouse name: Not on file  . Number of children: 0  . Years of education: Not on file  . Highest education level: Not on file  Occupational History  . Occupation: works Estate manager/land agent  Social Needs  . Financial resource strain: Not on file  . Food insecurity    Worry: Not on file    Inability: Not on file  . Transportation needs    Medical: Not on file    Non-medical: Not on file  Tobacco Use  . Smoking status: Never Smoker  . Smokeless tobacco: Never Used  Substance and Sexual Activity  . Alcohol use: Yes    Alcohol/week: 10.0 standard drinks    Types: 10 Standard drinks or equivalent per week    Comment: states he has quit drinking alcohol  . Drug use: No  . Sexual activity: Not on file  Lifestyle  . Physical activity    Days per week: Not on file     Minutes per session: Not on file  . Stress: Not on file  Relationships  . Social Musician on phone: Not on file    Gets together: Not on file    Attends religious service: Not on file    Active member of club or organization: Not on file    Attends meetings of clubs or organizations: Not on file    Relationship status: Not on file  . Intimate partner violence    Fear of current or ex partner: Not on file    Emotionally abused: Not on file    Physically abused: Not on file    Forced sexual activity: Not on file  Other Topics Concern  . Not on file  Social History Narrative   Lives in [place] by himself.  Works locally as a Stage manager.    Family History  Problem Relation Age of Onset  . Coronary artery disease Mother 55       alive @ 74  . Coronary artery disease Father        had MI in his 28's - died @ 64  . Heart attack Brother 51       alive - 65    Review of Systems:  As stated in the HPI and otherwise negative.   BP 108/82   Pulse 82   Ht 5\' 10"  (1.778 m)   Wt 211 lb 3.2 oz (95.8 kg)   SpO2 98%   BMI 30.30 kg/m   Physical Examination:  General: Well developed, well nourished, NAD  HEENT: OP clear, mucus membranes moist  SKIN: warm, dry. No rashes. Neuro: No focal deficits  Musculoskeletal: Muscle strength 5/5 all ext  Psychiatric: Mood and affect normal  Neck: No JVD, no carotid bruits, no thyromegaly, no lymphadenopathy.  Lungs:Clear bilaterally, no wheezes, rhonci, crackles Cardiovascular: Regular rate and rhythm. No murmurs, gallops or rubs. Abdomen:Soft. Bowel sounds present. Non-tender.  Extremities: No lower extremity edema. Pulses are 2 + in the bilateral DP/PT.  Cardiac cath 03/30/11: Left main: No evidence of disease.  Left Anterior Descending Artery: Large vessel that courses to the apex. The stent in the mid vessel is patent with minimal in-stent restenosis. Moderate sized diagonal branch with 50% proximal stenosis.    Circumflex Artery: Early intermediate branch with 40% proximal stenosis. The mid vessel has a 99% diffuse stenosis extending down into the first obtuse marginal branch. This is a small to moderate sized vessel. The AV  groove Circumflex is very small in caliber beyond the takeoff of the OM branch.  Right Coronary Artery: Large dominant vessel with mild 205 plaque in the mid vessel.  Left Ventricular Angiogram: LVEF 60-65%. NO wall motion abormalities. No MR noted.  Impression:  1. Double vessel CAD  2. NSTEMI secondary to severe stenosis mid Circumflex/OM1.  3. Patent stent mid LAD  4. Successful PTCA/DES x2 (overlapping stents) mid Circumflex/OM1.  5. Preserved LV systolic function.  Recommendations: ASABrilinta for at least one year.  Complications: None. The patient tolerated the procedure well.   EKG:  EKG is  ordered today. The ekg ordered today demonstrates NSR, rate 82 bpm. Incomplete RBBB  Recent Labs: No results found for requested labs within last 8760 hours.   Lipid Panel Followed in primary care   Wt Readings from Last 3 Encounters:  12/19/18 211 lb 3.2 oz (95.8 kg)  10/25/17 205 lb 12.8 oz (93.4 kg)  05/07/15 211 lb 3.2 oz (95.8 kg)     Other studies Reviewed: Additional studies/ records that were reviewed today include: . Review of the above records demonstrates:    Assessment and Plan:   1. CAD with angina: He has no exertional chest pain. He is feeling heaviness at rest that feels like a heavy beat. No irregularity of his heart rhythm. Continue ASA, Plavix, statin and beta blocker. Will arrange echo to assess LV systolic function and will arrange an exercise stress test to exclude ischemia.  Addendum: Pt with high risk findings on exercise stress test on 01/16/19. We have arranged for a [facility] tomorrow (01/18/19) with Dr. Saunders Revel. I reviewed the cath procedure with the patient today by telephone. Pre-cath labs today and Covid testing.   2. HTN: BP is  controlled. No changes today  3. HLD: Lipids are followed in primary care. Continue statin.    Current medicines are reviewed at length with the patient today.  The patient does not have concerns regarding medicines.  The following changes have been made:  no change  Labs/ tests ordered today include:   Orders Placed This Encounter  Procedures  . Exercise Tolerance Test  . EKG 12-Lead  . ECHOCARDIOGRAM COMPLETE    Disposition:   FU with me in 12  months  Signed, Lauree Chandler, MD 12/19/2018 4:23 PM    [facility] [place], [place],   [address] Phone: [phone]; Fax: [phone]

## 2018-12-19 NOTE — Progress Notes (Addendum)
 Chief Complaint  Patient presents with  . Follow-up    CAD   History of Present Illness: 58 yo male with history of CAD, HTN and HLD who is here today for cardiac follow up. He presented in March 2009 with an anterior STEMI. A 2.75 x 28 mm bare metal stent was placed in the proximal LAD. He was readmitted February 2013 with unstable angina. Cardiac cath 03/30/11 with severe stenosis OM1 treated with overlapping drug eluting stents (2.25 x 32 mm Promus Element DES, 2.25 x 16 mm Promus Element DES). There was a 50% diagonal stenosis, 20% RCA stenosis at that time.  He is here today for follow up. The patient denies any dyspnea, lower extremity edema, orthopnea, PND, dizziness, near syncope or syncope. He has had chest pains after meals. This is not similar to his anginal pain prior to his stents. He is feeling heaviness at rest that feels like a heavy beat. No irregularity of his heart rhythm.No exertional chest pain or associated dyspnea or nausea.   Primary Care Physician: Boyd, Tammy Lamonica, MD  Past Medical History:  Diagnosis Date  . Coronary artery disease    a. 04/2007 - Ant Stemi: LM: nl, LAD: 100p ( 2.75 x 28 Vision BMS ), LCX: 70-75, RI: 40/50, RCA: 30m, EF 35-40%.;  b. 01/2011 - Abnl ETT (Inf-Lat ST dep);  c.  02/2011 - Ex MV:  Dist ant-sept Infarct.  No ischemia.;  d. 03/30/11 NSTEMI - Cath: 99 LCX into OM1, otw nonobs dzs & NL LV - LCX stented w/ 2 promus DES  . Habitual alcohol use    a. at least a 12 pack/wk (03/30/2011)  . Hypercholesteremia   . Hypertension   . PUD (peptic ulcer disease)    in his 20s    Past Surgical History:  Procedure Laterality Date  . LEFT HEART CATHETERIZATION WITH CORONARY ANGIOGRAM N/A 03/30/2011   Procedure: LEFT HEART CATHETERIZATION WITH CORONARY ANGIOGRAM;  Surgeon:  D , MD;  Location: [facility]ATH [facility];  Service: Cardiovascular;  Laterality: N/A;  . None      Current Outpatient Medications  Medication Sig Dispense Refill  .  aspirin 81 MG tablet Take 81 mg by mouth daily.    . atorvastatin (LIPITOR) 80 MG tablet TAKE 1 TABLET BY MOUTH EVERY DAY 90 tablet 0  . b complex vitamins tablet Take 1 tablet by mouth as needed.    . Calcium-Magnesium-Vitamin D (CALCIUM MAGNESIUM PO) Take by mouth. Pt takes this occasionally    . carvedilol (COREG) 6.25 MG tablet TAKE 1 TABLET (6.25 MG TOTAL) BY MOUTH 2 (TWO) TIMES DAILY WITH A MEAL. 180 tablet 1  . clopidogrel (PLAVIX) 75 MG tablet Take 1 tablet (75 mg total) by mouth daily. 90 tablet 0  . Coenzyme Q10 (COQ10 PO) Take 100 mg by mouth daily.    . famotidine (PEPCID) 20 MG tablet Take 20 mg by mouth daily.    . FLAXSEED-EVE PRIM-BORAGE PO Take by mouth. Pt takes this daily by mouth    . Multiple Vitamin (MULTIVITAMIN) tablet Take 1 tablet by mouth daily.    . nitroGLYCERIN (NITROSTAT) 0.4 MG SL tablet PLACE 1 TABLET UNDER THE TONGUE EVERY 5 (FIVE) MINUTES AS NEEDED FOR CHEST PAIN. 25 tablet 6  . Zinc 100 MG TABS Take 30 mg by mouth as needed.      No current facility-administered medications for this visit.     No Known Allergies  Social History   Socioeconomic History  .   Marital status: Single    Spouse name: Not on file  . Number of children: 0  . Years of education: Not on file  . Highest education level: Not on file  Occupational History  . Occupation: works customer service [employer]  Social Needs  . Financial resource strain: Not on file  . Food insecurity    Worry: Not on file    Inability: Not on file  . Transportation needs    Medical: Not on file    Non-medical: Not on file  Tobacco Use  . Smoking status: Never Smoker  . Smokeless tobacco: Never Used  Substance and Sexual Activity  . Alcohol use: Yes    Alcohol/week: 10.0 standard drinks    Types: 10 Standard drinks or equivalent per week    Comment: states he has quit drinking alcohol  . Drug use: No  . Sexual activity: Not on file  Lifestyle  . Physical activity    Days per week: Not on file     Minutes per session: Not on file  . Stress: Not on file  Relationships  . Social connections    Talks on phone: Not on file    Gets together: Not on file    Attends religious service: Not on file    Active member of club or organization: Not on file    Attends meetings of clubs or organizations: Not on file    Relationship status: Not on file  . Intimate partner violence    Fear of current or ex partner: Not on file    Emotionally abused: Not on file    Physically abused: Not on file    Forced sexual activity: Not on file  Other Topics Concern  . Not on file  Social History Narrative   Lives in [place] by himself.  Works locally as a customer service operator.    Family History  Problem Relation Age of Onset  . Coronary artery disease Mother 70       alive @ 77  . Coronary artery disease Father        had MI in his 60's - died @ 87  . Heart attack Brother 51       alive - 54    Review of Systems:  As stated in the HPI and otherwise negative.   BP 108/82   Pulse 82   Ht 5' 10" (1.778 m)   Wt 211 lb 3.2 oz (95.8 kg)   SpO2 98%   BMI 30.30 kg/m   Physical Examination:  General: Well developed, well nourished, NAD  HEENT: OP clear, mucus membranes moist  SKIN: warm, dry. No rashes. Neuro: No focal deficits  Musculoskeletal: Muscle strength 5/5 all ext  Psychiatric: Mood and affect normal  Neck: No JVD, no carotid bruits, no thyromegaly, no lymphadenopathy.  Lungs:Clear bilaterally, no wheezes, rhonci, crackles Cardiovascular: Regular rate and rhythm. No murmurs, gallops or rubs. Abdomen:Soft. Bowel sounds present. Non-tender.  Extremities: No lower extremity edema. Pulses are 2 + in the bilateral DP/PT.  Cardiac cath 03/30/11: Left main: No evidence of disease.  Left Anterior Descending Artery: Large vessel that courses to the apex. The stent in the mid vessel is patent with minimal in-stent restenosis. Moderate sized diagonal branch with 50% proximal stenosis.    Circumflex Artery: Early intermediate branch with 40% proximal stenosis. The mid vessel has a 99% diffuse stenosis extending down into the first obtuse marginal branch. This is a small to moderate sized vessel. The AV   groove Circumflex is very small in caliber beyond the takeoff of the OM branch.  Right Coronary Artery: Large dominant vessel with mild 205 plaque in the mid vessel.  Left Ventricular Angiogram: LVEF 60-65%. NO wall motion abormalities. No MR noted.  Impression:  1. Double vessel CAD  2. NSTEMI secondary to severe stenosis mid Circumflex/OM1.  3. Patent stent mid LAD  4. Successful PTCA/DES x2 (overlapping stents) mid Circumflex/OM1.  5. Preserved LV systolic function.  Recommendations: ASABrilinta for at least one year.  Complications: None. The patient tolerated the procedure well.   EKG:  EKG is  ordered today. The ekg ordered today demonstrates NSR, rate 82 bpm. Incomplete RBBB  Recent Labs: No results found for requested labs within last 8760 hours.   Lipid Panel Followed in primary care   Wt Readings from Last 3 Encounters:  12/19/18 211 lb 3.2 oz (95.8 kg)  10/25/17 205 lb 12.8 oz (93.4 kg)  05/07/15 211 lb 3.2 oz (95.8 kg)     Other studies Reviewed: Additional studies/ records that were reviewed today include: . Review of the above records demonstrates:    Assessment and Plan:   1. CAD with angina: He has no exertional chest pain. He is feeling heaviness at rest that feels like a heavy beat. No irregularity of his heart rhythm. Continue ASA, Plavix, statin and beta blocker. Will arrange echo to assess LV systolic function and will arrange an exercise stress test to exclude ischemia.  Addendum: Pt with high risk findings on exercise stress test on 01/16/19. We have arranged for a cardiac [facility] at [facility] tomorrow (01/18/19) with Dr. End. I reviewed the cath procedure with the patient today by telephone. Pre-cath labs today and Covid testing.   2. HTN: BP is  controlled. No changes today  3. HLD: Lipids are followed in primary care. Continue statin.    Current medicines are reviewed at length with the patient today.  The patient does not have concerns regarding medicines.  The following changes have been made:  no change  Labs/ tests ordered today include:   Orders Placed This Encounter  Procedures  . Exercise Tolerance Test  . EKG 12-Lead  . ECHOCARDIOGRAM COMPLETE    Disposition:   FU with me in 12  months  Signed,  , MD 12/19/2018 4:23 PM    [facility] [address], [place], [place]  [address] Phone: [phone]; Fax: [phone]  

## 2018-12-24 ENCOUNTER — Encounter (HOSPITAL_COMMUNITY): Payer: Self-pay | Admitting: Cardiovascular Disease

## 2019-01-03 ENCOUNTER — Ambulatory Visit (HOSPITAL_COMMUNITY): Payer: 59 | Attending: Cardiovascular Disease

## 2019-01-03 ENCOUNTER — Other Ambulatory Visit: Payer: Self-pay

## 2019-01-03 DIAGNOSIS — E7800 Pure hypercholesterolemia, unspecified: Secondary | ICD-10-CM | POA: Insufficient documentation

## 2019-01-03 DIAGNOSIS — I2510 Atherosclerotic heart disease of native coronary artery without angina pectoris: Secondary | ICD-10-CM | POA: Insufficient documentation

## 2019-01-03 DIAGNOSIS — I10 Essential (primary) hypertension: Secondary | ICD-10-CM | POA: Insufficient documentation

## 2019-01-11 ENCOUNTER — Telehealth (HOSPITAL_COMMUNITY): Payer: Self-pay

## 2019-01-11 NOTE — Telephone Encounter (Signed)
Encounter complete. 

## 2019-01-12 ENCOUNTER — Other Ambulatory Visit (HOSPITAL_COMMUNITY)
Admission: RE | Admit: 2019-01-12 | Discharge: 2019-01-12 | Disposition: A | Discharge status: Home / Self Care | Payer: 59 | Source: Ambulatory Visit | Attending: Cardiovascular Disease | Admitting: Cardiovascular Disease

## 2019-01-12 DIAGNOSIS — Z01812 Encounter for preprocedural laboratory examination: Secondary | ICD-10-CM | POA: Insufficient documentation

## 2019-01-12 DIAGNOSIS — Z20828 Contact with and (suspected) exposure to other viral communicable diseases: Secondary | ICD-10-CM | POA: Diagnosis not present

## 2019-01-13 LAB — NOVEL CORONAVIRUS, NAA (HOSP ORDER, SEND-OUT TO REF LAB; TAT 18-24 HRS): SARS-CoV-2, NAA: NOT DETECTED

## 2019-01-15 ENCOUNTER — Telehealth (HOSPITAL_COMMUNITY): Payer: Self-pay

## 2019-01-15 NOTE — Telephone Encounter (Signed)
Encounter complete. 

## 2019-01-16 ENCOUNTER — Ambulatory Visit (HOSPITAL_COMMUNITY)
Admission: RE | Admit: 2019-01-16 | Discharge: 2019-01-16 | Disposition: A | Discharge status: Home / Self Care | Payer: 59 | Source: Ambulatory Visit | Attending: Cardiology | Admitting: Cardiology

## 2019-01-16 ENCOUNTER — Other Ambulatory Visit: Payer: Self-pay

## 2019-01-16 ENCOUNTER — Encounter (HOSPITAL_COMMUNITY): Payer: Self-pay | Admitting: *Deleted

## 2019-01-16 DIAGNOSIS — E7800 Pure hypercholesterolemia, unspecified: Secondary | ICD-10-CM

## 2019-01-16 DIAGNOSIS — I10 Essential (primary) hypertension: Secondary | ICD-10-CM | POA: Diagnosis present

## 2019-01-16 DIAGNOSIS — I2510 Atherosclerotic heart disease of native coronary artery without angina pectoris: Secondary | ICD-10-CM | POA: Diagnosis present

## 2019-01-16 LAB — EXERCISE TOLERANCE TEST
Estimated workload: 8.2 METS
Exercise duration (min): 6 min
Exercise duration (sec): 48 s
MPHR: 162 {beats}/min
Peak HR: 171 {beats}/min
Percent HR: 105 %
Rest HR: 80 {beats}/min

## 2019-01-16 NOTE — Progress Notes (Unsigned)
Abnormal ETT was reviewed by Dr. Percival Spanish. He sent a message to Dr. Angelena Form. Patient was given the ok to be discharged to go home.

## 2019-01-17 ENCOUNTER — Encounter: Payer: Self-pay | Admitting: *Deleted

## 2019-01-17 ENCOUNTER — Other Ambulatory Visit (HOSPITAL_COMMUNITY)
Admission: RE | Admit: 2019-01-17 | Discharge: 2019-01-17 | Disposition: A | Discharge status: Home / Self Care | Payer: 59 | Source: Ambulatory Visit | Attending: Internal Medicine | Admitting: Internal Medicine

## 2019-01-17 ENCOUNTER — Other Ambulatory Visit: Payer: 59 | Admitting: *Deleted

## 2019-01-17 ENCOUNTER — Telehealth: Payer: Self-pay | Admitting: *Deleted

## 2019-01-17 DIAGNOSIS — Z01812 Encounter for preprocedural laboratory examination: Secondary | ICD-10-CM | POA: Diagnosis present

## 2019-01-17 DIAGNOSIS — Z20828 Contact with and (suspected) exposure to other viral communicable diseases: Secondary | ICD-10-CM | POA: Diagnosis not present

## 2019-01-17 LAB — BASIC METABOLIC PANEL
BUN/Creatinine Ratio: 11 (ref 9–20)
BUN: 11 mg/dL (ref 6–24)
CO2: 27 mmol/L (ref 20–29)
Calcium: 10.4 mg/dL — ABNORMAL HIGH (ref 8.7–10.2)
Chloride: 102 mmol/L (ref 96–106)
Creatinine, Ser: 0.99 mg/dL (ref 0.76–1.27)
GFR calc Af Amer: 97 mL/min/{1.73_m2} (ref >59)
GFR calc non Af Amer: 84 mL/min/{1.73_m2} (ref >59)
Glucose: 87 mg/dL (ref 65–99)
Potassium: 4.4 mmol/L (ref 3.5–5.2)
Sodium: 138 mmol/L (ref 134–144)

## 2019-01-17 LAB — CBC
Hematocrit: 43.4 % (ref 37.5–51.0)
Hemoglobin: 14.9 g/dL (ref 13.0–17.7)
MCH: 31.6 pg (ref 26.6–33.0)
MCHC: 34.3 g/dL (ref 31.5–35.7)
MCV: 92 fL (ref 79–97)
Platelets: 175 10*3/uL (ref 150–450)
RBC: 4.72 x10E6/uL (ref 4.14–5.80)
RDW: 14.8 % (ref 11.6–15.4)
WBC: 6.9 10*3/uL (ref 3.4–10.8)

## 2019-01-17 LAB — SARS CORONAVIRUS 2 (TAT 6-24 HRS): SARS Coronavirus 2: NEGATIVE

## 2019-01-17 NOTE — Addendum Note (Signed)
Addended by: Lauree Chandler D on: 01/17/2019 02:13 PM   Modules accepted: Orders, SmartSet

## 2019-01-17 NOTE — Telephone Encounter (Signed)
The patient spoke with Dr. Angelena Form and is in agreement to cath tomorrow 01/18/19.   Scheduled with Dr. Saunders Revel at 9:00 am. Germaine Pomfret come for labs and COVID screening this afternoon. Will pick up instruction letter at that time.

## 2019-01-17 NOTE — Telephone Encounter (Signed)
-----   Message from Burnell Blanks, MD sent at 01/17/2019  8:09 AM EST ----- Abnormal stress test yesterday. He will need a cardiac cath. Can we let him know and see if he can do this tomorrow? If so, we can arrange and can call him later to discuss everything. Thanks, chris

## 2019-01-18 ENCOUNTER — Ambulatory Visit (HOSPITAL_COMMUNITY)
Admission: RE | Admit: 2019-01-18 | Discharge: 2019-01-19 | Disposition: A | Discharge status: Home / Self Care | Payer: 59 | Attending: Internal Medicine | Admitting: Internal Medicine

## 2019-01-18 ENCOUNTER — Encounter (HOSPITAL_COMMUNITY): Payer: Self-pay | Admitting: Internal Medicine

## 2019-01-18 ENCOUNTER — Other Ambulatory Visit: Payer: Self-pay

## 2019-01-18 ENCOUNTER — Encounter (HOSPITAL_COMMUNITY)
Admission: RE | Disposition: A | Discharge status: Home / Self Care | Payer: 59 | Source: Home / Self Care | Attending: Internal Medicine

## 2019-01-18 DIAGNOSIS — E785 Hyperlipidemia, unspecified: Secondary | ICD-10-CM | POA: Insufficient documentation

## 2019-01-18 DIAGNOSIS — R9439 Abnormal result of other cardiovascular function study: Secondary | ICD-10-CM | POA: Diagnosis not present

## 2019-01-18 DIAGNOSIS — Z7982 Long term (current) use of aspirin: Secondary | ICD-10-CM | POA: Diagnosis not present

## 2019-01-18 DIAGNOSIS — E7800 Pure hypercholesterolemia, unspecified: Secondary | ICD-10-CM | POA: Diagnosis present

## 2019-01-18 DIAGNOSIS — I2510 Atherosclerotic heart disease of native coronary artery without angina pectoris: Secondary | ICD-10-CM | POA: Diagnosis present

## 2019-01-18 DIAGNOSIS — Z7902 Long term (current) use of antithrombotics/antiplatelets: Secondary | ICD-10-CM | POA: Diagnosis not present

## 2019-01-18 DIAGNOSIS — I10 Essential (primary) hypertension: Secondary | ICD-10-CM | POA: Diagnosis present

## 2019-01-18 DIAGNOSIS — I25119 Atherosclerotic heart disease of native coronary artery with unspecified angina pectoris: Secondary | ICD-10-CM | POA: Diagnosis present

## 2019-01-18 DIAGNOSIS — Z79899 Other long term (current) drug therapy: Secondary | ICD-10-CM | POA: Insufficient documentation

## 2019-01-18 DIAGNOSIS — I252 Old myocardial infarction: Secondary | ICD-10-CM | POA: Diagnosis not present

## 2019-01-18 DIAGNOSIS — Z955 Presence of coronary angioplasty implant and graft: Secondary | ICD-10-CM

## 2019-01-18 HISTORY — PX: LEFT HEART CATH AND CORONARY ANGIOGRAPHY: CATH118249

## 2019-01-18 HISTORY — PX: CORONARY STENT INTERVENTION: CATH118234

## 2019-01-18 HISTORY — PX: INTRAVASCULAR PRESSURE WIRE/FFR STUDY: CATH118243

## 2019-01-18 HISTORY — PX: CARDIAC CATHETERIZATION: SHX172

## 2019-01-18 LAB — POCT ACTIVATED CLOTTING TIME: Activated Clotting Time: 301 seconds

## 2019-01-18 SURGERY — LEFT HEART CATH AND CORONARY ANGIOGRAPHY
Anesthesia: LOCAL

## 2019-01-18 MED ORDER — SODIUM CHLORIDE 0.9 % IV SOLN: 250.0000 mL | INTRAVENOUS | Status: DC | PRN

## 2019-01-18 MED ORDER — FENTANYL CITRATE (PF) 100 MCG/2ML IJ SOLN
INTRAMUSCULAR | Status: AC
  Filled 2019-01-18: qty 2

## 2019-01-18 MED ORDER — VERAPAMIL HCL 2.5 MG/ML IV SOLN
INTRAVENOUS | Status: AC
  Filled 2019-01-18: qty 2

## 2019-01-18 MED ORDER — SODIUM CHLORIDE 0.9 % WEIGHT BASED INFUSION: 1.0000 mL/kg/h | INTRAVENOUS | Status: DC

## 2019-01-18 MED ORDER — IOHEXOL 350 MG/ML SOLN
INTRAVENOUS | Status: DC | PRN
  Administered 2019-01-18: 10:00:00 100 mL

## 2019-01-18 MED ORDER — FAMOTIDINE IN NACL 20-0.9 MG/50ML-% IV SOLN
20.0000 mg | Freq: Once | INTRAVENOUS | Status: AC
  Administered 2019-01-18: 20 mg via INTRAVENOUS

## 2019-01-18 MED ORDER — SODIUM CHLORIDE 0.9 % WEIGHT BASED INFUSION: 3.0000 mL/kg/h | INTRAVENOUS | Status: DC

## 2019-01-18 MED ORDER — CARVEDILOL 6.25 MG PO TABS
6.2500 mg | ORAL_TABLET | Freq: Two times a day (BID) | ORAL | Status: DC
  Administered 2019-01-18 – 2019-01-19 (×2): 6.25 mg via ORAL
  Filled 2019-01-18 (×2): qty 1

## 2019-01-18 MED ORDER — LIDOCAINE HCL (PF) 1 % IJ SOLN
INTRAMUSCULAR | Status: AC
  Filled 2019-01-18: qty 30

## 2019-01-18 MED ORDER — SODIUM CHLORIDE 0.9% FLUSH: 3.0000 mL | INTRAVENOUS | Status: DC | PRN

## 2019-01-18 MED ORDER — SODIUM CHLORIDE 0.9% FLUSH: 3.0000 mL | Freq: Two times a day (BID) | INTRAVENOUS | Status: DC

## 2019-01-18 MED ORDER — HEPARIN (PORCINE) IN NACL 1000-0.9 UT/500ML-% IV SOLN
INTRAVENOUS | Status: AC
  Filled 2019-01-18: qty 1000

## 2019-01-18 MED ORDER — NITROGLYCERIN 1 MG/10 ML FOR IR/CATH LAB
INTRA_ARTERIAL | Status: AC
  Filled 2019-01-18: qty 10

## 2019-01-18 MED ORDER — NITROGLYCERIN 0.4 MG/SPRAY TL SOLN
1.0000 | Status: DC | PRN
  Administered 2019-01-18 (×2): 1 via SUBLINGUAL

## 2019-01-18 MED ORDER — MIDAZOLAM HCL 2 MG/2ML IJ SOLN
INTRAMUSCULAR | Status: AC
  Filled 2019-01-18: qty 2

## 2019-01-18 MED ORDER — ONDANSETRON HCL 4 MG/2ML IJ SOLN: 4.0000 mg | Freq: Four times a day (QID) | INTRAMUSCULAR | Status: DC | PRN

## 2019-01-18 MED ORDER — CLOPIDOGREL BISULFATE 75 MG PO TABS
75.0000 mg | ORAL_TABLET | Freq: Every day | ORAL | Status: DC
  Administered 2019-01-19: 75 mg via ORAL
  Filled 2019-01-18: qty 1

## 2019-01-18 MED ORDER — HEPARIN SODIUM (PORCINE) 1000 UNIT/ML IJ SOLN
INTRAMUSCULAR | Status: AC
  Filled 2019-01-18: qty 1

## 2019-01-18 MED ORDER — ASPIRIN 81 MG PO CHEW: 81.0000 mg | CHEWABLE_TABLET | ORAL | Status: DC

## 2019-01-18 MED ORDER — COQ-10 100 MG PO CAPS: 100.0000 mg | ORAL_CAPSULE | Freq: Every day | ORAL | Status: DC

## 2019-01-18 MED ORDER — FAMOTIDINE 20 MG PO TABS: 20.0000 mg | ORAL_TABLET | Freq: Every day | ORAL | Status: DC

## 2019-01-18 MED ORDER — NITROGLYCERIN 0.4 MG/SPRAY TL SOLN
Status: AC
  Filled 2019-01-18: qty 4.9

## 2019-01-18 MED ORDER — SODIUM CHLORIDE 0.9 % WEIGHT BASED INFUSION
3.0000 mL/kg/h | INTRAVENOUS | Status: DC
  Administered 2019-01-18: 3 mL/kg/h via INTRAVENOUS

## 2019-01-18 MED ORDER — NITROGLYCERIN 0.4 MG SL SUBL: 0.4000 mg | SUBLINGUAL_TABLET | SUBLINGUAL | Status: DC | PRN

## 2019-01-18 MED ORDER — LIDOCAINE HCL (PF) 1 % IJ SOLN
INTRAMUSCULAR | Status: DC | PRN
  Administered 2019-01-18: 2 mL

## 2019-01-18 MED ORDER — HEPARIN SODIUM (PORCINE) 1000 UNIT/ML IJ SOLN
INTRAMUSCULAR | Status: DC | PRN
  Administered 2019-01-18 (×2): 5000 [IU] via INTRAVENOUS

## 2019-01-18 MED ORDER — ACETAMINOPHEN 325 MG PO TABS
650.0000 mg | ORAL_TABLET | ORAL | Status: DC | PRN
  Administered 2019-01-18: 650 mg via ORAL
  Filled 2019-01-18: qty 2

## 2019-01-18 MED ORDER — ASPIRIN EC 81 MG PO TBEC
81.0000 mg | DELAYED_RELEASE_TABLET | Freq: Every day | ORAL | Status: DC
  Administered 2019-01-19: 81 mg via ORAL
  Filled 2019-01-18: qty 1

## 2019-01-18 MED ORDER — LABETALOL HCL 5 MG/ML IV SOLN: 10.0000 mg | INTRAVENOUS | Status: AC | PRN

## 2019-01-18 MED ORDER — NITROGLYCERIN 1 MG/10 ML FOR IR/CATH LAB
INTRA_ARTERIAL | Status: DC | PRN
  Administered 2019-01-18 (×3): 200 ug via INTRACORONARY

## 2019-01-18 MED ORDER — FENTANYL CITRATE (PF) 100 MCG/2ML IJ SOLN
INTRAMUSCULAR | Status: DC | PRN
  Administered 2019-01-18: 50 ug via INTRAVENOUS

## 2019-01-18 MED ORDER — CLOPIDOGREL BISULFATE 300 MG PO TABS
ORAL_TABLET | ORAL | Status: DC | PRN
  Administered 2019-01-18: 300 mg via ORAL

## 2019-01-18 MED ORDER — VERAPAMIL HCL 2.5 MG/ML IV SOLN
INTRAVENOUS | Status: DC | PRN
  Administered 2019-01-18: 09:00:00 10 mL via INTRA_ARTERIAL

## 2019-01-18 MED ORDER — FAMOTIDINE IN NACL 20-0.9 MG/50ML-% IV SOLN
INTRAVENOUS | Status: AC
  Filled 2019-01-18: qty 50

## 2019-01-18 MED ORDER — CLOPIDOGREL BISULFATE 300 MG PO TABS
ORAL_TABLET | ORAL | Status: AC
  Filled 2019-01-18: qty 1

## 2019-01-18 MED ORDER — HEPARIN (PORCINE) IN NACL 1000-0.9 UT/500ML-% IV SOLN
INTRAVENOUS | Status: DC | PRN
  Administered 2019-01-18 (×2): 500 mL

## 2019-01-18 MED ORDER — SODIUM CHLORIDE 0.9 % IV SOLN: INTRAVENOUS | Status: AC

## 2019-01-18 MED ORDER — MIDAZOLAM HCL 2 MG/2ML IJ SOLN
INTRAMUSCULAR | Status: DC | PRN
  Administered 2019-01-18: 1 mg via INTRAVENOUS

## 2019-01-18 MED ORDER — SODIUM CHLORIDE 0.9% FLUSH
3.0000 mL | Freq: Two times a day (BID) | INTRAVENOUS | Status: DC
  Administered 2019-01-18: 3 mL via INTRAVENOUS

## 2019-01-18 MED ORDER — ATORVASTATIN CALCIUM 80 MG PO TABS
80.0000 mg | ORAL_TABLET | Freq: Every day | ORAL | Status: DC
  Administered 2019-01-18: 80 mg via ORAL
  Filled 2019-01-18: qty 1

## 2019-01-18 MED ORDER — HYDRALAZINE HCL 20 MG/ML IJ SOLN: 10.0000 mg | INTRAMUSCULAR | Status: AC | PRN

## 2019-01-18 SURGICAL SUPPLY — 17 items
BALLN SAPPHIRE ~~LOC~~ 2.25X18 (BALLOONS) ×1 IMPLANT
CATH IMPULSE 5F ANG/FL3.5 (CATHETERS) ×1 IMPLANT
CATH LAUNCHER 6FR EBU3.5 (CATHETERS) ×1 IMPLANT
CATH VISTA GUIDE 6FR JR4 (CATHETERS) ×1 IMPLANT
DEVICE RAD COMP TR BAND LRG (VASCULAR PRODUCTS) ×1 IMPLANT
GLIDESHEATH SLEND SS 6F .021 (SHEATH) ×1 IMPLANT
GUIDEWIRE INQWIRE 1.5J.035X260 (WIRE) IMPLANT
GUIDEWIRE PRESSURE COMET II (WIRE) ×1 IMPLANT
INQWIRE 1.5J .035X260CM (WIRE) ×2
KIT ENCORE 26 ADVANTAGE (KITS) ×1 IMPLANT
KIT ESSENTIALS PG (KITS) ×1 IMPLANT
KIT HEART LEFT (KITS) ×2 IMPLANT
PACK CARDIAC CATHETERIZATION (CUSTOM PROCEDURE TRAY) ×2 IMPLANT
STENT SYNERGY DES 2.25X24 (Permanent Stent) ×1 IMPLANT
TRANSDUCER W/STOPCOCK (MISCELLANEOUS) ×2 IMPLANT
TUBING CIL FLEX 10 FLL-RA (TUBING) ×2 IMPLANT
WIRE COUGAR XT STRL 190CM (WIRE) ×1 IMPLANT

## 2019-01-18 NOTE — Progress Notes (Signed)
PHARMACIST - PHYSICIAN ORDER COMMUNICATION  CONCERNING: P&T Medication Policy on Herbal Medications  DESCRIPTION:  This patient's order for:  Co-Q Enzyme  has been noted.  This product(s) is classified as an "herbal" or natural product. Due to a lack of definitive safety studies or FDA approval, nonstandard manufacturing practices, plus the potential risk of unknown drug-drug interactions while on inpatient medications, the Pharmacy and Therapeutics Committee does not permit the use of "herbal" or natural products of this type within [facility].   ACTION TAKEN: The pharmacy department is unable to verify this order at this time and your patient has been informed of this safety policy. Please reevaluate patient's clinical condition at discharge and address if the herbal or natural product(s) should be resumed at that time.  Antonietta Jewel, PharmD, [facility] Clinical Pharmacist  Phone: [phone]  Please check AMION for all [facility] phone numbers After 10:00 PM, call [place] [phone]

## 2019-01-18 NOTE — Interval H&P Note (Signed)
History and Physical Interval Note:  01/18/2019 8:41 AM  [name]  has presented today for cardiac catheterization, with the diagnosis of chest pain and abnormal stress test.  The various methods of treatment have been discussed with the patient and family. After consideration of risks, benefits and other options for treatment, the patient has consented to  Procedure(s): LEFT HEART CATH AND CORONARY ANGIOGRAPHY (N/A) as a surgical intervention.  The patient's history has been reviewed, patient examined, no change in status, stable for surgery.  I have reviewed the patient's chart and labs.  Questions were answered to the patient's satisfaction.    Cath Lab Visit (complete for each Cath Lab visit)  Clinical Evaluation Leading to the Procedure:   ACS: No.  Non-ACS:    Anginal Classification: CCS IV  Anti-ischemic medical therapy: Minimal Therapy (1 class of medications)  Non-Invasive Test Results: High-risk stress test findings: cardiac mortality >3%/year  Prior CABG: No previous CABG  [name]

## 2019-01-19 DIAGNOSIS — I25119 Atherosclerotic heart disease of native coronary artery with unspecified angina pectoris: Secondary | ICD-10-CM | POA: Diagnosis not present

## 2019-01-19 DIAGNOSIS — I10 Essential (primary) hypertension: Secondary | ICD-10-CM | POA: Diagnosis not present

## 2019-01-19 DIAGNOSIS — E785 Hyperlipidemia, unspecified: Secondary | ICD-10-CM | POA: Diagnosis not present

## 2019-01-19 DIAGNOSIS — I252 Old myocardial infarction: Secondary | ICD-10-CM | POA: Diagnosis not present

## 2019-01-19 LAB — CBC
HCT: 42.5 % (ref 39.0–52.0)
Hemoglobin: 14.4 g/dL (ref 13.0–17.0)
MCH: 31.6 pg (ref 26.0–34.0)
MCHC: 33.9 g/dL (ref 30.0–36.0)
MCV: 93.2 fL (ref 80.0–100.0)
Platelets: 148 10*3/uL — ABNORMAL LOW (ref 150–400)
RBC: 4.56 MIL/uL (ref 4.22–5.81)
RDW: 13.8 % (ref 11.5–15.5)
WBC: 4.6 10*3/uL (ref 4.0–10.5)
nRBC: 0 % (ref 0.0–0.2)

## 2019-01-19 LAB — BASIC METABOLIC PANEL
Anion gap: 9 (ref 5–15)
BUN: 9 mg/dL (ref 6–20)
CO2: 27 mmol/L (ref 22–32)
Calcium: 9.1 mg/dL (ref 8.9–10.3)
Chloride: 105 mmol/L (ref 98–111)
Creatinine, Ser: 0.96 mg/dL (ref 0.61–1.24)
GFR calc Af Amer: >60 mL/min (ref >60)
GFR calc non Af Amer: >60 mL/min (ref >60)
Glucose, Bld: 85 mg/dL (ref 70–99)
Potassium: 4 mmol/L (ref 3.5–5.1)
Sodium: 141 mmol/L (ref 135–145)

## 2019-01-19 LAB — LIPID PANEL
Cholesterol: 167 mg/dL (ref 0–200)
HDL: 46 mg/dL (ref >40)
LDL Cholesterol: 80 mg/dL (ref 0–99)
Total CHOL/HDL Ratio: 3.6 RATIO
Triglycerides: 204 mg/dL — ABNORMAL HIGH (ref <150)
VLDL: 41 mg/dL — ABNORMAL HIGH (ref 0–40)

## 2019-01-19 MED ORDER — CLOPIDOGREL BISULFATE 75 MG PO TABS: 75.0000 mg | ORAL_TABLET | Freq: Every day | ORAL | 3 refills | Status: DC

## 2019-01-19 NOTE — Progress Notes (Signed)
CARDIAC REHAB PHASE I   PRE:  Rate/Rhythm: 87 SR  BP:  Supine:   Sitting: 150/100 Standing:    SaO2: 98% RA  MODE:  Ambulation: 940 ft   POST:  Rate/Rhythm: 95 SR  BP:  Supine: 148/100 Sitting:   Standing:    SaO2: 99% RA  1275-1700 Patient tolerated ambulation well without symptoms, gait steady. To bed after walk, blood pressure elevated before and after walk. PCI education complete including restrictions, CP, NTG use, and calling 911, risk factor modification, and activity progression. Heart healthy diet and exercise handout given. Patient verbalizes understanding of instructions given.  Pt is interested in participating in Virtual Cardiac and Pulmonary Rehab. Pt advised that Virtual Cardiac and Pulmonary Rehab is provided at no cost to the patient.  Checklist:  1. Pt has smart device  ie smartphone and/or ipad for downloading an app  Yes 2. Reliable internet/wifi service    No  3. Understands how to use their smartphone and navigate within an app.  Yes   Pt verbalized understanding and is in agreement.  Sol Passer, MS, ACSM CEP

## 2019-01-19 NOTE — Discharge Summary (Addendum)
Discharge Summary    Patient ID: [name] MRN: [id number]; DOB: 29-May-1960  Admit date: 01/18/2019 Discharge date: 01/19/2019  Primary Care Provider: Bartholome Bill, MD  Primary Cardiologist: Lauree Chandler, MD  Primary Electrophysiologist:  None   Discharge Diagnoses    Principal Problem:   Coronary artery disease Active Problems:   HYPERCHOLESTEROLEMIA   Essential hypertension   Abnormal stress test   Coronary artery disease with angina pectoris [facility])    Diagnostic Studies/Procedures    Left heart cath 01/18/19: Conclusions: 1. Multivessel coronary artery disease, including 70-80% ramus intermedius stenosis, 40% proximal LCx disease, and 60% mid RCA lesion (not hemodynamically significant by DFR). 2. Patent LAD and LCx stents.  There is mild in-stent restenosis involving the proximal/mid LAD. 3. Mildly elevated left ventricular filling pressure. 4. Successful PCI to ramus intermedius using Synergy 2.25 x 24 mm drug-eluting stent with 0% residual stenosis and TIMI-3 flow.  Recommendations: 1. Overnight extended recovery, as the patient does not have a caregiver to assist him at home today. 2. Continue indefinite dual antiplatelet therapy with aspirin and clopidogrel. 3. Aggressive secondary prevention. _____________   Echo 01/03/19: 1. Left ventricular ejection fraction, by visual estimation, is 60 to 65%. The left ventricle has normal function. Left ventricular septal wall thickness was normal. Mildly increased left ventricular posterior wall thickness. There is mildly increased  left ventricular hypertrophy.  2. Global right ventricle has normal systolic function.The right ventricular size is normal. No increase in right ventricular wall thickness.  3. Left atrial size was normal.  4. Right atrial size was normal.  5. The mitral valve is normal in structure. No evidence of mitral valve regurgitation. No evidence of mitral stenosis.  6. The tricuspid  valve is normal in structure. Tricuspid valve regurgitation is trivial.  7. The aortic valve is normal in structure. Aortic valve regurgitation is not visualized. No evidence of aortic valve sclerosis or stenosis.  8. The pulmonic valve was normal in structure. Pulmonic valve regurgitation is not visualized.  9. Normal pulmonary artery systolic pressure. 10. The inferior vena cava is normal in size with greater than 50% respiratory variability, suggesting right atrial pressure of 3 mmHg.   History of Present Illness     [name] is a 58 y.o. male with a history of CAD, HTN, and HLD. He has a hx of anterior STEMI treated with BMS to proximal LAD in 04/2007. He was admitted 03/2011 with unstable angina. Heart cath at that time 03/30/11 with severe stenosis in OM1 trated with overlapping DES. There was residual stenosis in the diagonal of 50% and 20% in the RCA. He presented to clinic for follow up on 12/19/18. He reported symptoms concerning for stable angina and was sent for echo and ETT. High risk findings on exercise stress test on 01/16/19 led to cardiac catheterization. He presented for angiography on 01/18/19.   Hospital Course     Consultants: none  CAD  Hx of STEMI (2009) Left heart cath revealed 70-80% stenosis in the ramus intermedius succesfully treated with DES. He has 40% stenosis in the proximal LCx and 60% stenosis in the RCA not hemodynamically significant by DFR.  Instructions were to continue DAPT with ASA and palvix indefinitely. Continue aggressive secondary prevention. He tolerated the procedure well.  Pt stayed overnight to recover after heart cath since he did not have help at home. Cath site C/D/I. Continue coreg 6.25 mg BID and statin.    Hypertension Pressure is well-controlled. Continue present medications.  Hyperlipidemia with LDL goal less than 70 Lipids followed by PCP. Continue statin.  Fasting lipid profile pending   Pt seen and examined by Dr. Tresa EndoKelly and  felt stable for discharge. Follow up will be made with cardiology.    Did the patient have an acute coronary syndrome (MI, NSTEMI, STEMI, etc) this admission?:  No                               Did the patient have a percutaneous coronary intervention (stent / angioplasty)?:  Yes.     Cath/PCI Registry Performance & Quality Measures: 4. Aspirin prescribed? - Yes 5. ADP Receptor Inhibitor (Plavix/Clopidogrel, Brilinta/Ticagrelor or Effient/Prasugrel) prescribed (includes medically managed patients)? - Yes 6. High Intensity Statin (Lipitor 40-80mg  or Crestor 20-40mg ) prescribed? - Yes 7. For EF <40%, was ACEI/ARB prescribed? - Not Applicable (EF >/= 40%) 8. For EF <40%, Aldosterone Antagonist (Spironolactone or Eplerenone) prescribed? - Not Applicable (EF >/= 40%) 9. Cardiac Rehab Phase II ordered (Included Medically managed Patients)? - Yes   _____________  Discharge Vitals Blood pressure (!) 127/95, pulse 79, temperature 98 F (36.7 C), temperature source Oral, resp. rate 18, height 5\' 10"  (1.778 m), weight 94.3 kg, SpO2 97 %.  Filed Weights   01/18/19 0712 01/19/19 0553  Weight: 97.5 kg 94.3 kg    Physical Exam  Constitutional: He is oriented to person, place, and time. He appears well-developed and well-nourished. No distress.  HENT:  Head: Normocephalic.  Neck: No JVD present.  Cardiovascular: Normal rate, regular rhythm and normal heart sounds. Exam reveals no friction rub.  No murmur heard. Pulmonary/Chest: Effort normal and breath sounds normal. No respiratory distress. He has no wheezes.  Abdominal: Soft. Bowel sounds are normal.  Musculoskeletal:        General: No edema.  Neurological: He is alert and oriented to person, place, and time.  Skin: Skin is warm and dry.  Psychiatric: He has a normal mood and affect.   Right radial cath site C/D/I, no hematoma  Labs & Radiologic Studies    CBC Recent Labs    01/17/19 1308 01/19/19 0420  WBC 6.9 4.6  HGB 14.9 14.4   HCT 43.4 42.5  MCV 92 93.2  PLT 175 148*   Basic Metabolic Panel Recent Labs    16/11/9610/17/20 1308 01/19/19 0420  NA 138 141  K 4.4 4.0  CL 102 105  CO2 27 27  GLUCOSE 87 85  BUN 11 9  CREATININE 0.99 0.96  CALCIUM 10.4* 9.1   Liver Function Tests No results for input(s): AST, ALT, ALKPHOS, BILITOT, PROT, ALBUMIN in the last 72 hours. No results for input(s): LIPASE, AMYLASE in the last 72 hours. High Sensitivity Troponin:   No results for input(s): TROPONINIHS in the last 720 hours.  BNP Invalid input(s): POCBNP D-Dimer No results for input(s): DDIMER in the last 72 hours. Hemoglobin A1C No results for input(s): HGBA1C in the last 72 hours. Fasting Lipid Panel No results for input(s): CHOL, HDL, LDLCALC, TRIG, CHOLHDL, LDLDIRECT in the last 72 hours. Thyroid Function Tests No results for input(s): TSH, T4TOTAL, T3FREE, THYROIDAB in the last 72 hours.  Invalid input(s): FREET3 _____________  CARDIAC CATHETERIZATION  Result Date: 01/18/2019 Conclusions: 1. Multivessel coronary artery disease, including 70-80% ramus intermedius stenosis, 40% proximal LCx disease, and 60% mid RCA lesion (not hemodynamically significant by DFR). 2. Patent LAD and LCx stents.  There is mild in-stent restenosis involving the proximal/mid  LAD. 3. Mildly elevated left ventricular filling pressure. 4. Successful PCI to ramus intermedius using Synergy 2.25 x 24 mm drug-eluting stent with 0% residual stenosis and TIMI-3 flow. Recommendations: 1. Overnight extended recovery, as the patient does not have a caregiver to assist him at home today. 2. Continue indefinite dual antiplatelet therapy with aspirin and clopidogrel. 3. Aggressive secondary prevention. Yvonne Kendall, MD [facility]   ECHOCARDIOGRAM COMPLETE  Result Date: 01/03/2019   ECHOCARDIOGRAM REPORT   Patient Name:   [name]  Date of Exam: 01/03/2019 Medical Rec #:  [id number]     Height:       70.0 in Accession #:    [id number]     Weight:       211.2 lb Date of Birth:  Sep 08, 1960     BSA:          2.14 m Patient Age:    58 years      BP:           150/93 mmHg Patient Gender: M             HR:           76 bpm. Exam Location:  [address] Procedure: 2D Echo, Cardiac Doppler and Color Doppler Indications:    I25.10 CAD  History:        Patient has prior history of Echocardiogram examinations, most                 recent 08/28/2007. Anterior STEMI (2009); Risk                 Factors:Hypertension and HLD.  Sonographer:    Clearence Ped RCS Referring Phys: [id number] CHRISTOPHER D MCALHANY IMPRESSIONS  1. Left ventricular ejection fraction, by visual estimation, is 60 to 65%. The left ventricle has normal function. Left ventricular septal wall thickness was normal. Mildly increased left ventricular posterior wall thickness. There is mildly increased left ventricular hypertrophy.  2. Global right ventricle has normal systolic function.The right ventricular size is normal. No increase in right ventricular wall thickness.  3. Left atrial size was normal.  4. Right atrial size was normal.  5. The mitral valve is normal in structure. No evidence of mitral valve regurgitation. No evidence of mitral stenosis.  6. The tricuspid valve is normal in structure. Tricuspid valve regurgitation is trivial.  7. The aortic valve is normal in structure. Aortic valve regurgitation is not visualized. No evidence of aortic valve sclerosis or stenosis.  8. The pulmonic valve was normal in structure. Pulmonic valve regurgitation is not visualized.  9. Normal pulmonary artery systolic pressure. 10. The inferior vena cava is normal in size with greater than 50% respiratory variability, suggesting right atrial pressure of 3 mmHg. FINDINGS  Left Ventricle: Left ventricular ejection fraction, by visual estimation, is 60 to 65%. The left ventricle has normal function. Mildly increased left ventricular posterior wall thickness. There is mildly increased left ventricular  hypertrophy. Normal left atrial pressure. Right Ventricle: The right ventricular size is normal. No increase in right ventricular wall thickness. Global RV systolic function is has normal systolic function. The tricuspid regurgitant velocity is 2.16 m/s, and with an assumed right atrial pressure  of 3 mmHg, the estimated right ventricular systolic pressure is normal at 21.7 mmHg. Left Atrium: Left atrial size was normal in size. Right Atrium: Right atrial size was normal in size Pericardium: There is no evidence of pericardial effusion. Mitral Valve: The mitral valve is normal in structure.  No evidence of mitral valve stenosis by observation. No evidence of mitral valve regurgitation. Tricuspid Valve: The tricuspid valve is normal in structure. Tricuspid valve regurgitation is trivial. Aortic Valve: The aortic valve is normal in structure. Aortic valve regurgitation is not visualized. The aortic valve is structurally normal, with no evidence of sclerosis or stenosis. Pulmonic Valve: The pulmonic valve was normal in structure. Pulmonic valve regurgitation is not visualized. Aorta: The aortic root, ascending aorta and aortic arch are all structurally normal, with no evidence of dilitation or obstruction. Venous: The inferior vena cava is normal in size with greater than 50% respiratory variability, suggesting right atrial pressure of 3 mmHg. IAS/Shunts: No atrial level shunt detected by color flow Doppler. There is no evidence of a patent foramen ovale. No ventricular septal defect is seen or detected. There is no evidence of an atrial septal defect.  LEFT VENTRICLE PLAX 2D LVIDd:         4.70 cm  Diastology LVIDs:         3.02 cm  LV e' lateral:   9.03 cm/s LV PW:         1.18 cm  LV E/e' lateral: 9.3 LV IVS:        0.83 cm  LV e' medial:    9.36 cm/s LVOT diam:     1.90 cm  LV E/e' medial:  9.0 LV SV:         67 ml LV SV Index:   30.36    2D Longitudinal Strain LVOT Area:     2.84 cm 2D Strain GLS (A2C):   -20.4 %                          2D Strain GLS (A3C):   -19.8 %                         2D Strain GLS (A4C):   -20.1 %                         2D Strain GLS Avg:     -20.1 % RIGHT VENTRICLE RV Basal diam:  3.20 cm RV S prime:     14.10 cm/s TAPSE (M-mode): 2.2 cm LEFT ATRIUM             Index       RIGHT ATRIUM          Index LA diam:        3.80 cm 1.78 cm/m  RA Area:     8.75 cm LA Vol (A2C):   51.7 ml 24.20 ml/m RA Volume:   15.50 ml 7.26 ml/m LA Vol (A4C):   48.9 ml 22.89 ml/m LA Biplane Vol: 51.4 ml 24.06 ml/m  AORTIC VALVE LVOT Vmax:   87.90 cm/s LVOT Vmean:  61.900 cm/s LVOT VTI:    0.165 m  AORTA Ao Root diam: 3.00 cm MITRAL VALVE                        TRICUSPID VALVE MV Area (PHT):                      TR Peak grad:   18.7 mmHg MV PHT:  TR Vmax:        241.00 cm/s MV Decel Time: 155 msec MV E velocity: 84.40 cm/s 103 cm/s  SHUNTS MV A velocity: 43.70 cm/s 70.3 cm/s Systemic VTI:  0.16 m MV E/A ratio:  1.93       1.5       Systemic Diam: 1.90 cm  Chilton Si MD Electronically signed by Chilton Si MD Signature Date/Time: 01/03/2019/5:30:18 PM    Final    Exercise Tolerance Test  Result Date: 01/16/2019  Blood pressure demonstrated a hypertensive response to exercise.  Horizontal ST segment depression ST segment depression of 5 mm was noted during stress in the II, III, V3, V4, V5, V6 and aVF leads.  Positive treadmill stress test. There is 5 mm of horizontal ST depression in inferior leads and 2-3 mm ST depression in leads V3-V6. I am also concerned for possible ~43mm ST elevation in V1 and aVL, with 3 mm ST elevation in aVR.  Concerning findings communicated to Dr. Clifton James, ordering provider.    Disposition   Pt is being discharged home today in good condition.  Follow-up Plans & Appointments    Follow-up Information    Filbert Schilder, NP Follow up on 02/04/2019.   Specialty: Cardiology Why: 3:00, be there by 2:45. Contact information: [address] [place] [address] [phone]          Discharge Instructions    AMB Referral to Cardiac Rehabilitation - Phase II   Complete by: As directed    Diagnosis: Coronary Stents   After initial evaluation and assessments completed: Virtual Based Care may be provided alone or in conjunction with Phase 2 Cardiac Rehab based on patient barriers.: Yes   Diet - low sodium heart healthy   Complete by: As directed    Discharge instructions   Complete by: As directed    No driving for 2 days. No lifting over 5 lbs for 1 week. No sexual activity for 1 week. You may return to work on 01/28/19. Keep procedure site clean & dry. If you notice increased pain, swelling, bleeding or pus, call/return!  You may shower, but no soaking baths/hot tubs/pools for 1 week.   Increase activity slowly   Complete by: As directed       Discharge Medications   Allergies as of 01/19/2019   No Known Allergies     Medication List    STOP taking these medications   BC FAST PAIN RELIEF PO   ibuprofen 200 MG tablet Commonly known as: ADVIL     TAKE these medications   aspirin 81 MG tablet Take 81 mg by mouth daily.   atorvastatin 80 MG tablet Commonly known as: LIPITOR TAKE 1 TABLET BY MOUTH EVERY DAY What changed: when to take this   b complex vitamins tablet Take 1 tablet by mouth daily.   calcium carbonate 500 MG chewable tablet Commonly known as: TUMS - dosed in mg elemental calcium Chew 1 tablet by mouth 3 (three) times daily as needed for indigestion or heartburn.   CALCIUM MAGNESIUM PO Take 1 tablet by mouth daily.   carvedilol 6.25 MG tablet Commonly known as: COREG TAKE 1 TABLET (6.25 MG TOTAL) BY MOUTH 2 (TWO) TIMES DAILY WITH A MEAL.   cetirizine 10 MG tablet Commonly known as: ZYRTEC Take 10 mg by mouth daily.   clopidogrel 75 MG tablet Commonly known as: PLAVIX Take 1 tablet (75 mg total) by mouth daily.   CoQ-10 100  MG Caps Take 100 mg by mouth daily.    famotidine 20 MG tablet Commonly known as: PEPCID Take 20 mg by mouth daily.   FLAXSEED-EVE PRIM-BORAGE PO Take 1 capsule by mouth daily.   multivitamin tablet Take 1 tablet by mouth daily.   nitroGLYCERIN 0.4 MG SL tablet Commonly known as: Nitrostat PLACE 1 TABLET UNDER THE TONGUE EVERY 5 (FIVE) MINUTES AS NEEDED FOR CHEST PAIN. What changed:   how much to take  how to take this  when to take this  reasons to take this  additional instructions   zinc gluconate 50 MG tablet Take 50 mg by mouth daily.          Outstanding Labs/Studies   none  Duration of Discharge Encounter   Greater than 30 minutes including physician time.  Signed, Roe Rutherford Duke, PA 01/19/2019, 8:44 AM  Patient seen and examined. Agree with assessment and plan.  Patient feels well following PCI to ramus intermediate vessel yesterday.  DFR to RCA was not significant and plan medical therapy.  Stents to LAD and circumflex are widely open.  Right radial site is stable.  Lungs are clear.  Rhythm is regular without ectopy.  No edema.  Plan discharge today with follow-up in several weeks and subsequent follow-up with Dr. Clifton James.   Lennette Bihari, MD, [facility] 01/19/2019 9:08 AM

## 2019-01-19 NOTE — Plan of Care (Signed)

## 2019-01-19 NOTE — Plan of Care (Signed)
  Problem: Cardiovascular: Goal: Vascular access site(s) Level 0-1 will be maintained Outcome: Progressing Note: Right radial site is Level 0.

## 2019-01-23 ENCOUNTER — Telehealth (HOSPITAL_COMMUNITY): Payer: Self-pay

## 2019-01-23 NOTE — Telephone Encounter (Signed)
Pt insurance is active and benefits verified through [facility]. Co-pay $35.00, DED $2,500.00/$0.00 met, out of pocket $5,000.00/$701.87 met, co-insurance 0%. No pre-authorization required. Passport, 01/23/2019 @ 12:04PM, [id number]  Will pass to RN Navigator for review.

## 2019-01-31 NOTE — Progress Notes (Signed)
Cardiology Office Note   Date:  02/04/2019   ID:  [name], DOB 1960-09-29, MRN [id number]  PCP:  Verlon AuBoyd, Tammy Lamonica, MD  Cardiologist: Dr. Clifton JamesMcAlhany, MD  Chief Complaint  Patient presents with  . Hospitalization Follow-up    History of Present Illness: [name] is a 58 y.o. male who presents for post cardiac catheterization follow-up, seen for Dr. Clifton JamesMcAlhany.  [name] has a history of CAD, HTN, and HLD. He has a hx of anterior STEMI treated with BMS to proximal LAD in 04/2007. He was admitted 03/2011 with unstable angina. LHC at that time with severe stenosis in OM1 treated with overlapping DES. There was residual stenosis in the diagonal of 50% and 20% in the RCA.   He then presented to clinic for follow up on 12/19/18. He reported symptoms concerning for stable angina and was sent for echo and ETT. High risk findings on exercise stress test on 01/16/19 led to cardiac catheterization performed on 01/18/19. Outpatient cardiac catheterization revealed 70 to 80% stenosis in the ramus intermedius successfully treated with DES/PCI.  He has residual 40% stenosis in the proximal LCx and 60% stenosis in the RCA not found to be hemodynamically significant by DFR. Recommendations were for continue DAPT with ASA and Plavix indefinitely with aggressive secondary prevention with good BP and LDL control.  Today [name] is doing very well from a CV standpoint.  Reports he feels much better since hospital discharge.  He denies recurrent chest pain, shortness of breath, palpitations, PND, LE edema, orthopnea, dizziness or syncope. Cath site is stable without signs of hematoma. Tolerating medications well without signs or symptoms of bleeding.  Plans to begin moderate exercise program as he has been fairly sedentary since his mother passed 09/2018.  We discussed tighter control of his LDL.  He does state he has had many diet indiscretions given Covid.  We will plan for repeat lipid panel prior to  next office visit.  Past Medical History:  Diagnosis Date  . Coronary artery disease    a. 04/2007 - Ant Stemi: LM: nl, LAD: 100p ( 2.75 x 28 Vision BMS ), LCX: 70-75, RI: 40/50, RCA: 7229m, EF 35-40%.;  b. 01/2011 - Abnl ETT (Inf-Lat ST dep);  c.  02/2011 - Ex MV:  Dist ant-sept Infarct.  No ischemia.;  d. 03/30/11 NSTEMI - Cath: 99 LCX into OM1, otw nonobs dzs & NL LV - LCX stented w/ 2 promus DES  . Habitual alcohol use    a. at least a 12 pack/wk (03/30/2011)  . Hypercholesteremia   . Hypertension   . PUD (peptic ulcer disease)    in his 3220s    Past Surgical History:  Procedure Laterality Date  . CARDIAC CATHETERIZATION  01/18/2019  . CORONARY STENT INTERVENTION  01/18/2019  . CORONARY STENT INTERVENTION N/A 01/18/2019   Procedure: CORONARY STENT INTERVENTION;  Surgeon: Yvonne KendallEnd, Christopher, MD;  [facility];  Service: Cardiovascular;  Laterality: N/A;  . INTRAVASCULAR PRESSURE WIRE/FFR STUDY N/A 01/18/2019   Procedure: INTRAVASCULAR PRESSURE WIRE/FFR STUDY;  Surgeon: Yvonne KendallEnd, Christopher, MD;  Location: [facility];  Service: Cardiovascular;  Laterality: N/A;  . LEFT HEART CATH AND CORONARY ANGIOGRAPHY N/A 01/18/2019   Procedure: LEFT HEART CATH AND CORONARY ANGIOGRAPHY;  Surgeon: Yvonne KendallEnd, Christopher, MD;  [facility]: [facility];  Service: Cardiovascular;  Laterality: N/A;  . LEFT HEART CATHETERIZATION WITH CORONARY ANGIOGRAM N/A 03/30/2011   Procedure: LEFT HEART CATHETERIZATION WITH CORONARY ANGIOGRAM;  Surgeon: Kathleene Hazel, MD;  Location: [facility] CATH LAB;  Service: Cardiovascular;  Laterality: N/A;  . None       Current Outpatient Medications  Medication Sig Dispense Refill  . aspirin 81 MG tablet Take 81 mg by mouth daily.    Marland Kitchen atorvastatin (LIPITOR) 80 MG tablet TAKE 1 TABLET BY MOUTH EVERY DAY 90 tablet 0  . b complex vitamins tablet Take 1 tablet by mouth daily.     . calcium carbonate (TUMS - DOSED IN MG ELEMENTAL CALCIUM) 500 MG chewable tablet Chew  1 tablet by mouth 3 (three) times daily as needed for indigestion or heartburn.    . Calcium-Magnesium-Vitamin D (CALCIUM MAGNESIUM PO) Take 1 tablet by mouth daily.    . carvedilol (COREG) 6.25 MG tablet TAKE 1 TABLET (6.25 MG TOTAL) BY MOUTH 2 (TWO) TIMES DAILY WITH A MEAL. 180 tablet 1  . cetirizine (ZYRTEC) 10 MG tablet Take 10 mg by mouth daily.    . clopidogrel (PLAVIX) 75 MG tablet Take 1 tablet (75 mg total) by mouth daily. 90 tablet 3  . Coenzyme Q10 (COQ-10) 100 MG CAPS Take 100 mg by mouth daily.    . famotidine (PEPCID) 20 MG tablet Take 20 mg by mouth daily.    Marland Kitchen FLAXSEED-EVE PRIM-BORAGE PO Take 1 capsule by mouth daily.     . Multiple Vitamin (MULTIVITAMIN) tablet Take 1 tablet by mouth daily.    . nitroGLYCERIN (NITROSTAT) 0.4 MG SL tablet PLACE 1 TABLET UNDER THE TONGUE EVERY 5 (FIVE) MINUTES AS NEEDED FOR CHEST PAIN. 25 tablet 3  . zinc gluconate 50 MG tablet Take 50 mg by mouth daily.     No current facility-administered medications for this visit.    Allergies:   Patient has no known allergies.    Social History:  The patient  reports that he has never smoked. He has never used smokeless tobacco. He reports current alcohol use of about 10.0 standard drinks of alcohol per week. He reports that he does not use drugs.   Family History:  The patient's family history includes Coronary artery disease in his father; Coronary artery disease (age of onset: 44) in his mother; Heart attack (age of onset: 19) in his brother.    ROS:  Please see the history of present illness. Otherwise, review of systems are positive for none.   All other systems are reviewed and negative.    PHYSICAL EXAM: VS:  There were no vitals taken for this visit. , BMI There is no height or weight on file to calculate BMI.   General: Well developed, well nourished, NAD Skin: Warm, dry, intact  Neck: Negative for carotid bruits. No JVD Lungs:Clear to ausculation bilaterally. No wheezes, rales, or  rhonchi. Breathing is unlabored. Cardiovascular: RRR with S1 S2. No murmur Extremities: No edema. DP pulses 2+ bilaterally Neuro: Alert and oriented. No focal deficits. No facial asymmetry. MAE spontaneously. Psych: Responds to questions appropriately with normal affect.     EKG:  EKG is not ordered today.   Recent Labs: 01/19/2019: BUN 9; Creatinine, Ser 0.96; Hemoglobin 14.4; Platelets 148; Potassium 4.0; Sodium 141    Lipid Panel    Component Value Date/Time   CHOL 167 01/19/2019 0420   TRIG 204 (H) 01/19/2019 0420   HDL 46 01/19/2019 0420   CHOLHDL 3.6 01/19/2019 0420   VLDL 41 (H) 01/19/2019 0420   LDLCALC 80 01/19/2019 0420    Wt Readings from Last 3 Encounters:  01/19/19 207 lb 14.3  oz (94.3 kg)  12/19/18 211 lb 3.2 oz (95.8 kg)  10/25/17 205 lb 12.8 oz (93.4 kg)     Other studies Reviewed: Additional studies/ records that were reviewed today include:   Left heart cath 01/18/19: Conclusions: 1. Multivessel coronary artery disease, including 70-80% ramus intermedius stenosis, 40% proximal LCx disease, and 60% mid RCA lesion (not hemodynamically significant by DFR). 2. Patent LAD and LCx stents. There is mild in-stent restenosis involving the proximal/mid LAD. 3. Mildly elevated left ventricular filling pressure. 4. Successful PCI to ramus intermedius using Synergy 2.25 x 24 mm drug-eluting stent with 0% residual stenosis and TIMI-3 flow.  Recommendations: 1. Overnight extended recovery, as the patient does not have a caregiver to assist him at home today. 2. Continue indefinite dual antiplatelet therapy with aspirin and clopidogrel. 3. Aggressive secondary prevention. _____________   Echo 01/03/19: 1. Left ventricular ejection fraction, by visual estimation, is 60 to 65%. The left ventricle has normal function. Left ventricular septal wall thickness was normal. Mildly increased left ventricular posterior wall thickness. There is mildly increased  left  ventricular hypertrophy. 2. Global right ventricle has normal systolic function.The right ventricular size is normal. No increase in right ventricular wall thickness. 3. Left atrial size was normal. 4. Right atrial size was normal. 5. The mitral valve is normal in structure. No evidence of mitral valve regurgitation. No evidence of mitral stenosis. 6. The tricuspid valve is normal in structure. Tricuspid valve regurgitation is trivial. 7. The aortic valve is normal in structure. Aortic valve regurgitation is not visualized. No evidence of aortic valve sclerosis or stenosis. 8. The pulmonic valve was normal in structure. Pulmonic valve regurgitation is not visualized. 9. Normal pulmonary artery systolic pressure. 10. The inferior vena cava is normal in size with greater than 50% respiratory variability, suggesting right atrial pressure of 3 mmHg.  ASSESSMENT AND PLAN:  1.  CAD with a history of STEMI, most recently treated with DES/PCI to ramus intermedius: -As above, presented with stable anginal symptoms in the office with plans for outpatient cardiac catheterization which revealed 70 to 80% stenosis in the ramus intermedius successfully treated with DES/PCI.  He has residual 40% stenosis in the proximal LCx and 60% stenosis in the RCA not found to be hemodynamically significant by DFR. -Plan is for continued DAPT with ASA and Plavix indefinitely as well as aggressive secondary prevention with good BP control and LDL less than 70. -Continue current regimen with ASA, Plavix, BB, statin  2.  Hypertension: -Well-controlled, 120/80 -Continue current regimen  3.  Hyperlipidemia: -Goal LDL less than 70 -Last LDL, 80 on 01/19/2019 -Continue statin -Plan to check lipid panel prior to next OV appointment    Current medicines are reviewed at length with the patient today.  The patient does not have concerns regarding medicines.  The following changes have been made:  no change  Labs/  tests ordered today include: Lipid panel at next OV  No orders of the defined types were placed in this encounter.   Disposition:   FU with Dr. Angelena Form  in 3 months  Signed, Kathyrn Drown, NP  02/04/2019 3:02 PM    [facility] [place], [place], [place]  [address] Phone: [phone]; Fax: [phone]

## 2019-02-04 ENCOUNTER — Ambulatory Visit (INDEPENDENT_AMBULATORY_CARE_PROVIDER_SITE_OTHER): Payer: 59 | Admitting: Cardiology

## 2019-02-04 ENCOUNTER — Encounter: Payer: Self-pay | Admitting: Cardiology

## 2019-02-04 ENCOUNTER — Other Ambulatory Visit: Payer: Self-pay

## 2019-02-04 VITALS — BP 120/80 | HR 84 | Ht 70.0 in | Wt 209.6 lb

## 2019-02-04 DIAGNOSIS — I10 Essential (primary) hypertension: Secondary | ICD-10-CM

## 2019-02-04 DIAGNOSIS — Z9861 Coronary angioplasty status: Secondary | ICD-10-CM

## 2019-02-04 DIAGNOSIS — I25119 Atherosclerotic heart disease of native coronary artery with unspecified angina pectoris: Secondary | ICD-10-CM | POA: Diagnosis not present

## 2019-02-04 DIAGNOSIS — E7800 Pure hypercholesterolemia, unspecified: Secondary | ICD-10-CM

## 2019-02-04 DIAGNOSIS — I2510 Atherosclerotic heart disease of native coronary artery without angina pectoris: Secondary | ICD-10-CM | POA: Diagnosis not present

## 2019-02-04 NOTE — Patient Instructions (Signed)
Medication Instructions:   Your physician recommends that you continue on your current medications as directed. Please refer to the Current Medication list given to you today.   *If you need a refill on your cardiac medications before your next appointment, please call your pharmacy*  Lab Work:  None ordered today  Testing/Procedures:  None ordered today  Follow-Up: At [facility], you and your health needs are our priority.  As part of our continuing mission to provide you with exceptional heart care, we have created designated Provider Care Teams.  These Care Teams include your primary Cardiologist (physician) and Advanced Practice Providers (APPs -  Physician Assistants and Nurse Practitioners) who all work together to provide you with the care you need, when you need it.  Your next appointment:   3 month(s)  The format for your next appointment:   In Person  Provider:   Christopher McAlhany, MD   

## 2019-02-14 ENCOUNTER — Other Ambulatory Visit: Payer: Self-pay | Admitting: Cardiovascular Disease

## 2019-02-15 ENCOUNTER — Encounter (HOSPITAL_COMMUNITY): Payer: Self-pay

## 2019-02-15 NOTE — Telephone Encounter (Signed)
Attempted to call patient in regards to Cardiac Rehab - LM on VM Mailed letter 

## 2019-05-29 ENCOUNTER — Other Ambulatory Visit: Payer: Self-pay

## 2019-05-29 ENCOUNTER — Telehealth: Payer: Self-pay | Admitting: Radiology

## 2019-05-29 ENCOUNTER — Ambulatory Visit: Payer: 59 | Admitting: Cardiovascular Disease

## 2019-05-29 ENCOUNTER — Encounter: Payer: Self-pay | Admitting: Cardiovascular Disease

## 2019-05-29 VITALS — BP 120/78 | HR 88 | Ht 70.0 in | Wt 210.0 lb

## 2019-05-29 DIAGNOSIS — E7800 Pure hypercholesterolemia, unspecified: Secondary | ICD-10-CM

## 2019-05-29 DIAGNOSIS — I10 Essential (primary) hypertension: Secondary | ICD-10-CM

## 2019-05-29 DIAGNOSIS — R002 Palpitations: Secondary | ICD-10-CM | POA: Diagnosis not present

## 2019-05-29 DIAGNOSIS — I2510 Atherosclerotic heart disease of native coronary artery without angina pectoris: Secondary | ICD-10-CM

## 2019-05-29 MED ORDER — EZETIMIBE 10 MG PO TABS: 10.0000 mg | ORAL_TABLET | Freq: Every day | ORAL | 3 refills | Status: DC

## 2019-05-29 NOTE — Progress Notes (Signed)
Chief Complaint  Patient presents with  . Follow-up    CAD   History of Present Illness: 59 yo male with history of CAD, HTN and HLD who is here today for cardiac follow up. He presented in March 2009 with an anterior STEMI. A 2.75 x 28 mm bare metal stent was placed in the proximal LAD. He was readmitted February 2013 with unstable angina. Cardiac cath 03/30/11 with severe stenosis OM1 treated with overlapping drug eluting stents (2.25 x 32 mm Promus Element DES, 2.25 x 16 mm Promus Element DES). There was a 50% diagonal stenosis, 20% RCA stenosis at that time. He was seen in our office November 2020 and reported chest pain. Echo December 2020 with LVEF=60-65%. No valve disease. Exercise stress test was abnormal. Cardiac cath 01/18/19 showed a severe stenosis in the intermediate branch that was treated with a drug eluting stent. Mild disease in the Circumflex. Moderate disease in the RCA that was not hemodynamically significant by DFR.   He is here today for follow up. The patient denies any chest pain, dyspnea, lower extremity edema, orthopnea, PND, dizziness, near syncope or syncope. He describes feeling his heart skipping at times. This happens several times per month.   Primary Care Physician: Verlon Au, MD  Past Medical History:  Diagnosis Date  . Coronary artery disease    a. 04/2007 - Ant Stemi: LM: nl, LAD: 100p ( 2.75 x 28 Vision BMS ), LCX: 70-75, RI: 40/50, RCA: 8m, EF 35-40%.;  b. 01/2011 - Abnl ETT (Inf-Lat ST dep);  c.  02/2011 - Ex MV:  Dist ant-sept Infarct.  No ischemia.;  d. 03/30/11 NSTEMI - Cath: 99 LCX into OM1, otw nonobs dzs & NL LV - LCX stented w/ 2 promus DES  . Habitual alcohol use    a. at least a 12 pack/wk (03/30/2011)  . Hypercholesteremia   . Hypertension   . PUD (peptic ulcer disease)    in his 5s    Past Surgical History:  Procedure Laterality Date  . CARDIAC CATHETERIZATION  01/18/2019  . CORONARY STENT INTERVENTION  01/18/2019  . CORONARY  STENT INTERVENTION N/A 01/18/2019   Procedure: CORONARY STENT INTERVENTION;  Surgeon: Yvonne Kendall, MD;  [facility]: [facility];  Service: Cardiovascular;  Laterality: N/A;  . INTRAVASCULAR PRESSURE WIRE/FFR STUDY N/A 01/18/2019   Procedure: INTRAVASCULAR PRESSURE WIRE/FFR STUDY;  Surgeon: Yvonne Kendall, MD;  [facility]: [facility];  Service: Cardiovascular;  Laterality: N/A;  . LEFT HEART CATH AND CORONARY ANGIOGRAPHY N/A 01/18/2019   Procedure: LEFT HEART CATH AND CORONARY ANGIOGRAPHY;  Surgeon: Yvonne Kendall, MD;  [facility]: [facility];  Service: Cardiovascular;  Laterality: N/A;  . LEFT HEART CATHETERIZATION WITH CORONARY ANGIOGRAM N/A 03/30/2011   Procedure: LEFT HEART CATHETERIZATION WITH CORONARY ANGIOGRAM;  Surgeon: Kathleene Hazel, MD;  Location: [facility] CATH LAB;  Service: Cardiovascular;  Laterality: N/A;  . None      Current Outpatient Medications  Medication Sig Dispense Refill  . aspirin 81 MG tablet Take 81 mg by mouth daily.    Marland Kitchen atorvastatin (LIPITOR) 80 MG tablet TAKE 1 TABLET BY MOUTH EVERY DAY 90 tablet 0  . b complex vitamins tablet Take 1 tablet by mouth daily.     . calcium carbonate (TUMS - DOSED IN MG ELEMENTAL CALCIUM) 500 MG chewable tablet Chew 1 tablet by mouth 3 (three) times daily as needed for indigestion or heartburn.    . Calcium-Magnesium-Vitamin D (CALCIUM MAGNESIUM PO) Take 1 tablet  by mouth daily.    . carvedilol (COREG) 6.25 MG tablet TAKE 1 TABLET (6.25 MG TOTAL) BY MOUTH 2 (TWO) TIMES DAILY WITH A MEAL. 180 tablet 3  . cetirizine (ZYRTEC) 10 MG tablet Take 10 mg by mouth daily.    . clopidogrel (PLAVIX) 75 MG tablet Take 1 tablet (75 mg total) by mouth daily. 90 tablet 3  . Coenzyme Q10 (COQ-10) 100 MG CAPS Take 100 mg by mouth daily.    . famotidine (PEPCID) 20 MG tablet Take 20 mg by mouth daily.    Marland Kitchen FLAXSEED-EVE PRIM-BORAGE PO Take 1 capsule by mouth daily.     . Multiple Vitamin (MULTIVITAMIN) tablet Take 1 tablet by  mouth daily.    . nitroGLYCERIN (NITROSTAT) 0.4 MG SL tablet PLACE 1 TABLET UNDER THE TONGUE EVERY 5 (FIVE) MINUTES AS NEEDED FOR CHEST PAIN. 25 tablet 3  . zinc gluconate 50 MG tablet Take 50 mg by mouth daily.    Marland Kitchen ezetimibe (ZETIA) 10 MG tablet Take 1 tablet (10 mg total) by mouth daily. 90 tablet 3   No current facility-administered medications for this visit.    No Known Allergies  Social History   Socioeconomic History  . Marital status: Single    Spouse name: Not on file  . Number of children: 0  . Years of education: Not on file  . Highest education level: Not on file  Occupational History  . Occupation: works Estate manager/land agent  Tobacco Use  . Smoking status: Never Smoker  . Smokeless tobacco: Never Used  Substance and Sexual Activity  . Alcohol use: Yes    Alcohol/week: 10.0 standard drinks    Types: 10 Standard drinks or equivalent per week    Comment: states he has quit drinking alcohol  . Drug use: No  . Sexual activity: Not on file  Other Topics Concern  . Not on file  Social History Narrative   Lives in [place] by himself.  Works locally as a Stage manager.   Social Determinants of Health   Financial Resource Strain:   . Difficulty of Paying Living Expenses:   Food Insecurity:   . Worried About Programme researcher, broadcasting/film/video in the Last Year:   . Barista in the Last Year:   Transportation Needs:   . Freight forwarder (Medical):   Marland Kitchen Lack of Transportation (Non-Medical):   Physical Activity:   . Days of Exercise per Week:   . Minutes of Exercise per Session:   Stress:   . Feeling of Stress :   Social Connections:   . Frequency of Communication with Friends and Family:   . Frequency of Social Gatherings with Friends and Family:   . Attends Religious Services:   . Active Member of Clubs or Organizations:   . Attends Banker Meetings:   Marland Kitchen Marital Status:   Intimate Partner Violence:   . Fear of Current or Ex-Partner:   .  Emotionally Abused:   Marland Kitchen Physically Abused:   . Sexually Abused:     Family History  Problem Relation Age of Onset  . Coronary artery disease Mother 22       alive @ 78  . Coronary artery disease Father        had MI in his 51's - died @ 46  . Heart attack Brother 51       alive - 8    Review of Systems:  As stated in the HPI and otherwise  negative.   BP 120/78   Pulse 88   Ht 5\' 10"  (1.778 m)   Wt 210 lb (95.3 kg)   SpO2 98%   BMI 30.13 kg/m   Physical Examination:  General: Well developed, well nourished, NAD  HEENT: OP clear, mucus membranes moist  SKIN: warm, dry. No rashes. Neuro: No focal deficits  Musculoskeletal: Muscle strength 5/5 all ext  Psychiatric: Mood and affect normal  Neck: No JVD, no carotid bruits, no thyromegaly, no lymphadenopathy.  Lungs:Clear bilaterally, no wheezes, rhonci, crackles Cardiovascular: Regular rate and rhythm. No murmurs, gallops or rubs. Abdomen:Soft. Bowel sounds present. Non-tender.  Extremities: No lower extremity edema. Pulses are 2 + in the bilateral DP/PT.  EKG:  EKG is  ordered today. The ekg ordered today demonstrates   Recent Labs: 01/19/2019: BUN 9; Creatinine, Ser 0.96; Hemoglobin 14.4; Platelets 148; Potassium 4.0; Sodium 141   Lipid Panel Followed in primary care   Wt Readings from Last 3 Encounters:  05/29/19 210 lb (95.3 kg)  02/04/19 209 lb 9.6 oz (95.1 kg)  01/19/19 207 lb 14.3 oz (94.3 kg)     Other studies Reviewed: Additional studies/ records that were reviewed today include: . Review of the above records demonstrates:    Assessment and Plan:   1. CAD without angina:  No chest pain. Continue ASA, Plavix, statin and beta blocker.   2. HTN: BP is well controlled. Continue current therapy  3. HLD: Lipids are followed in primary care. LDL not at goal December 2020. Will add Zetia 10 mg daily. Continue statin. Check lipids and LFTs in 12 weeks.   4. Palpitations: Will arrange a 14 day Zio monitor.    Current medicines are reviewed at length with the patient today.  The patient does not have concerns regarding medicines.  The following changes have been made:  no change  Labs/ tests ordered today include:   Orders Placed This Encounter  Procedures  . Hepatic function panel  . Lipid panel  . LONG TERM MONITOR (3-14 DAYS)    Disposition:   FU with me in 12  months  Signed, Lauree Chandler, MD 05/29/2019 4:28 PM    [facility] [address], [place], [place]  [address] Phone: [phone]; Fax: [phone]

## 2019-05-29 NOTE — Patient Instructions (Addendum)
Medication Instructions:  Your physician has recommended you make the following change in your medication:  1.) start ezetimibe (Zetia) 10 mg once a day  *If you need a refill on your cardiac medications before your next appointment, please call your pharmacy*   Lab Work: In 3 months: lipids/liver function  If you have labs (blood work) drawn today and your tests are completely normal, you will receive your results only by: Marland Kitchen MyChart Message (if you have MyChart) OR . A paper copy in the mail If you have any lab test that is abnormal or we need to change your treatment, we will call you to review the results.   Testing/Procedures: Your physician has recommended that you wear a 14 day Zio heart monitor. Heart monitors are medical devices that record the heart's electrical activity. Doctors most often use these monitors to diagnose arrhythmias. Arrhythmias are problems with the speed or rhythm of the heartbeat. The monitor is a small, portable device. You can wear one while you do your normal daily activities. This is usually used to diagnose what is causing palpitations/syncope (passing out).   Follow-Up: At [facility], you and your health needs are our priority.  As part of our continuing mission to provide you with exceptional heart care, we have created designated Provider Care Teams.  These Care Teams include your primary Cardiologist (physician) and Advanced Practice Providers (APPs -  Physician Assistants and Nurse Practitioners) who all work together to provide you with the care you need, when you need it.  We recommend signing up for the patient portal called "MyChart".  Sign up information is provided on this After Visit Summary.  MyChart is used to connect with patients for Virtual Visits (Telemedicine).  Patients are able to view lab/test results, encounter notes, upcoming appointments, etc.  Non-urgent messages can be sent to your provider as well.   To learn more about what  you can do with MyChart, go to [email or web address].    Your next appointment:   12 month(s)  The format for your next appointment:   Either In Person or Virtual  Provider:   You may see Verne Carrow, MD or one of the following Advanced Practice Providers on your designated Care Team:    Ronie Spies, PA-C  Jacolyn Reedy, PA-C  Other Instructions

## 2019-05-29 NOTE — Telephone Encounter (Signed)
Enrolled patient for a 14 day Zio monitor to be mailed to patients home.  

## 2019-06-09 ENCOUNTER — Other Ambulatory Visit (INDEPENDENT_AMBULATORY_CARE_PROVIDER_SITE_OTHER): Payer: 59

## 2019-06-09 DIAGNOSIS — I10 Essential (primary) hypertension: Secondary | ICD-10-CM

## 2019-06-09 DIAGNOSIS — E7800 Pure hypercholesterolemia, unspecified: Secondary | ICD-10-CM | POA: Diagnosis not present

## 2019-06-09 DIAGNOSIS — I2510 Atherosclerotic heart disease of native coronary artery without angina pectoris: Secondary | ICD-10-CM

## 2019-06-09 DIAGNOSIS — R002 Palpitations: Secondary | ICD-10-CM | POA: Diagnosis not present

## 2019-07-23 ENCOUNTER — Other Ambulatory Visit: Payer: Self-pay

## 2019-07-23 ENCOUNTER — Encounter: Payer: Self-pay | Admitting: Physician Assistant

## 2019-07-23 ENCOUNTER — Ambulatory Visit (INDEPENDENT_AMBULATORY_CARE_PROVIDER_SITE_OTHER): Payer: 59 | Admitting: Physician Assistant

## 2019-07-23 VITALS — BP 128/86 | HR 72 | Ht 71.0 in | Wt 209.0 lb

## 2019-07-23 DIAGNOSIS — I480 Paroxysmal atrial fibrillation: Secondary | ICD-10-CM | POA: Diagnosis not present

## 2019-07-23 DIAGNOSIS — E7800 Pure hypercholesterolemia, unspecified: Secondary | ICD-10-CM

## 2019-07-23 DIAGNOSIS — I10 Essential (primary) hypertension: Secondary | ICD-10-CM

## 2019-07-23 DIAGNOSIS — I214 Non-ST elevation (NSTEMI) myocardial infarction: Secondary | ICD-10-CM | POA: Diagnosis not present

## 2019-07-23 MED ORDER — RIVAROXABAN 20 MG PO TABS: 20.0000 mg | ORAL_TABLET | Freq: Every day | ORAL | 6 refills | Status: DC

## 2019-07-23 NOTE — Patient Instructions (Signed)
Medication Instructions:   START Xarelto 20 mg daily  *If you need a refill on your cardiac medications before your next appointment, please call your pharmacy*   Lab Work: Your physician recommends that you return for a FASTING lipid profile and CMET in August. If you have blood work at [facility], you will need to call and make an appointment.  If you have labs (blood work) drawn today and your tests are completely normal, you will receive your results only by: Marland Kitchen MyChart Message (if you have MyChart) OR . A paper copy in the mail If you have any lab test that is abnormal or we need to change your treatment, we will call you to review the results.   Follow-Up: At [facility], you and your health needs are our priority.  As part of our continuing mission to provide you with exceptional heart care, we have created designated Provider Care Teams.  These Care Teams include your primary Cardiologist (physician) and Advanced Practice Providers (APPs -  Physician Assistants and Nurse Practitioners) who all work together to provide you with the care you need, when you need it.  We recommend signing up for the patient portal called "MyChart".  Sign up information is provided on this After Visit Summary.  MyChart is used to connect with patients for Virtual Visits (Telemedicine).  Patients are able to view lab/test results, encounter notes, upcoming appointments, etc.  Non-urgent messages can be sent to your provider as well.   To learn more about what you can do with MyChart, go to [email or web address].    Your next appointment:   6 month(s)  The format for your next appointment:   In Person  Provider:   You may see Verne Carrow, MD or one of the following Advanced Practice Providers on your designated Care Team:    Ronie Spies, PA-C  Jacolyn Reedy, PA-C    Other Instructions  Bjorn Loser has recommended that you discontinue the use of BC Powders and limit alcohol and  caffeine intake. Limit sodas to 2 per day.

## 2019-07-23 NOTE — Progress Notes (Signed)
Cardiology Office Note   Date:  07/23/2019   ID:  KORBIN MAPPS, DOB [birth year], MRN [id number]  PCP:  Bartholome Bill, MD Cardiologist:  Lauree Chandler, MD 05/29/2019 Electrphysiologist: None Rosaria Ferries, PA-C    History of Present Illness: [name] is a 59 y.o. male with a history of STEMI 04/2007 s/p BMS LAD, cath 03/2011 s/p overlapping DES OM1, cath 01/2019 with DES OM (med Rx for moderate disease RCA and mild disease circumflex), HTN, HLD, EtOH use  4/28 office visit, patient stable from an ischemia standpoint, Zetia added, check lipids and LFTs in 12 weeks, patient complaining of palpitations and ZIO monitor ordered 6/11 monitor results reviewed, patient with episodes of SVT and atrial fibrillation, appointment made  [name] presents for cardiology evaluation  He drinks 2 Mtn Dews a day. He has been doing BC powders frequently because of tooth pain. He is drinking 2 six-packs a week, but says can cut back.   He has not been exercising much, can do more.   He has been busy at work, the [employer] exploded during the pandemic.   He has not had any chest pain to speak of. Maybe a twinge every once in a while. Has not had to take nitro.  He has a tender area upper L quadrant, was told it was fatty liver.   Has palpitations at times, they don't last long. No presyncope or syncope.    Past Medical History:  Diagnosis Date  . Coronary artery disease    a. 04/2007 - Ant Stemi: LM: nl, LAD: 100p ( 2.75 x 28 Vision BMS ), LCX: 70-75, RI: 40/50, RCA: 86m, EF 35-40%.;  b. 01/2011 - Abnl ETT (Inf-Lat ST dep);  c.  02/2011 - Ex MV:  Dist ant-sept Infarct.  No ischemia.;  d. 03/30/11 NSTEMI - Cath: 99 LCX into OM1, otw nonobs dzs & NL LV - LCX stented w/ 2 promus DES  . Habitual alcohol use    a. at least a 12 pack/wk (03/30/2011)  . Hypercholesteremia   . Hypertension   . PUD (peptic ulcer disease)    in his 46s    Past Surgical History:  Procedure  Laterality Date  . CARDIAC CATHETERIZATION  01/18/2019  . CORONARY STENT INTERVENTION  01/18/2019  . CORONARY STENT INTERVENTION N/A 01/18/2019   Procedure: CORONARY STENT INTERVENTION;  Surgeon: Nelva Bush, MD;  Location: [facility];  Service: Cardiovascular;  Laterality: N/A;  . INTRAVASCULAR PRESSURE WIRE/FFR STUDY N/A 01/18/2019   Procedure: INTRAVASCULAR PRESSURE WIRE/FFR STUDY;  Surgeon: Nelva Bush, MD;  Location: [facility];  Service: Cardiovascular;  Laterality: N/A;  . LEFT HEART CATH AND CORONARY ANGIOGRAPHY N/A 01/18/2019   Procedure: LEFT HEART CATH AND CORONARY ANGIOGRAPHY;  Surgeon: Nelva Bush, MD;  Location: [facility];  Service: Cardiovascular;  Laterality: N/A;  . LEFT HEART CATHETERIZATION WITH CORONARY ANGIOGRAM N/A 03/30/2011   Procedure: LEFT HEART CATHETERIZATION WITH CORONARY ANGIOGRAM;  Surgeon: Burnell Blanks, MD;  Location: [facility];  Service: Cardiovascular;  Laterality: N/A;  . None      Current Outpatient Medications  Medication Sig Dispense Refill  . aspirin 81 MG tablet Take 81 mg by mouth daily.    Marland Kitchen atorvastatin (LIPITOR) 80 MG tablet TAKE 1 TABLET BY MOUTH EVERY DAY 90 tablet 0  . b complex vitamins tablet Take 1 tablet by mouth daily.     . calcium carbonate (TUMS - DOSED IN MG  ELEMENTAL CALCIUM) 500 MG chewable tablet Chew 1 tablet by mouth 3 (three) times daily as needed for indigestion or heartburn.    . Calcium-Magnesium-Vitamin D (CALCIUM MAGNESIUM PO) Take 1 tablet by mouth daily.    . carvedilol (COREG) 6.25 MG tablet TAKE 1 TABLET (6.25 MG TOTAL) BY MOUTH 2 (TWO) TIMES DAILY WITH A MEAL. 180 tablet 3  . cetirizine (ZYRTEC) 10 MG tablet Take 10 mg by mouth daily.    . clopidogrel (PLAVIX) 75 MG tablet Take 1 tablet (75 mg total) by mouth daily. 90 tablet 3  . Coenzyme Q10 (COQ-10) 100 MG CAPS Take 100 mg by mouth daily.    Marland Kitchen ezetimibe (ZETIA) 10 MG tablet Take 1 tablet (10 mg total) by mouth daily.  90 tablet 3  . famotidine (PEPCID) 20 MG tablet Take 20 mg by mouth daily.    Marland Kitchen FLAXSEED-EVE PRIM-BORAGE PO Take 1 capsule by mouth daily.     . Multiple Vitamin (MULTIVITAMIN) tablet Take 1 tablet by mouth daily.    . nitroGLYCERIN (NITROSTAT) 0.4 MG SL tablet PLACE 1 TABLET UNDER THE TONGUE EVERY 5 (FIVE) MINUTES AS NEEDED FOR CHEST PAIN. 25 tablet 3  . zinc gluconate 50 MG tablet Take 50 mg by mouth daily.     No current facility-administered medications for this visit.    Allergies:   Patient has no known allergies.    Social History:  The patient  reports that he has never smoked. He has never used smokeless tobacco. He reports current alcohol use of about 10.0 standard drinks of alcohol per week. He reports that he does not use drugs.   Family History:  The patient's family history includes Coronary artery disease in his father; Coronary artery disease (age of onset: 54) in his mother; Heart attack (age of onset: 60) in his brother.  He indicated that his mother is alive. He indicated that his father is deceased. He indicated that the status of his brother is unknown. He indicated that his maternal grandmother is deceased. He indicated that his maternal grandfather is deceased. He indicated that his paternal grandmother is deceased. He indicated that his paternal grandfather is deceased.    ROS:  Please see the history of present illness. All other systems are reviewed and negative.    PHYSICAL EXAM: VS:  BP 128/86   Pulse 72   Ht 5\' 11"  (1.803 m)   Wt 209 lb (94.8 kg)   BMI 29.15 kg/m  , BMI Body mass index is 29.15 kg/m. GEN: Well nourished, well developed, male in no acute distress HEENT: normal for age  Neck: no JVD, no carotid bruit, no masses Cardiac: RRR; no murmur, no rubs, or gallops Respiratory:  clear to auscultation bilaterally, normal work of breathing GI: soft, nontender, nondistended, + BS MS: no deformity or atrophy; no edema; distal pulses are 2+ in all 4  extremities  Skin: warm and dry, no rash Neuro:  Strength and sensation are intact Psych: euthymic mood, full affect   EKG:  EKG is ordered today. The ekg ordered today demonstrates sinus rhythm, heart rate 72, no acute ischemic changes, minimal ST elevation in inferior leads, unclear clinical significance.  Minimal changes from 01/2019 ECG  ECHO: 01/03/2019 1. Left ventricular ejection fraction, by visual estimation, is 60 to  65%. The left ventricle has normal function. Left ventricular septal wall  thickness was normal. Mildly increased left ventricular posterior wall  thickness. There is mildly increased  left ventricular hypertrophy.  2. Global  right ventricle has normal systolic function.The right  ventricular size is normal. No increase in right ventricular wall  thickness.  3. Left atrial size was normal.  4. Right atrial size was normal.  5. The mitral valve is normal in structure. No evidence of mitral valve  regurgitation. No evidence of mitral stenosis.  6. The tricuspid valve is normal in structure. Tricuspid valve  regurgitation is trivial.  7. The aortic valve is normal in structure. Aortic valve regurgitation is  not visualized. No evidence of aortic valve sclerosis or stenosis.  8. The pulmonic valve was normal in structure. Pulmonic valve  regurgitation is not visualized.  9. Normal pulmonary artery systolic pressure.  10. The inferior vena cava is normal in size with greater than 50%  respiratory variability, suggesting right atrial pressure of 3 mmHg.  CATH: 01/18/2019 Conclusions: 1. Multivessel coronary artery disease, including 70-80% ramus intermedius stenosis, 40% proximal LCx disease, and 60% mid RCA lesion (not hemodynamically significant by DFR). 2. Patent LAD and LCx stents.  There is mild in-stent restenosis involving the proximal/mid LAD. 3. Mildly elevated left ventricular filling pressure. 4. Successful PCI to ramus intermedius using  Synergy 2.25 x 24 mm drug-eluting stent with 0% residual stenosis and TIMI-3 flow.  Recommendations: 1. Overnight extended recovery, as the patient does not have a caregiver to assist him at home today. 2. Continue indefinite dual antiplatelet therapy with aspirin and clopidogrel. 3. Aggressive secondary prevention.    MONITOR: 07/12/2019 Patient had a min HR 61 bpm, max HR of 162 bpm, and avg HR of 83 bpm. Predominant underlying rhythm was Sinus Rhythm. 1 run of Ventricular Tachycardia occurred lasting 5 beats with a max rate of 141 bpm (avg 125 bpm). 15 Supraventricular Tachycardia runs occurred, the run with the fastest interval lasting 5 beats with a max rate of 162 bpm, the longest lasting 14 beats with an avg rate of 137 bpm. Some episodes of Supraventricular Tachycardia may be possible Atrial Tachycardia with variable block. Atrial Fibrillation occurred (<1% burden), ranging from 72-142 bpm (avg of 99 bpm), the longest lasting 1 hour 3 mins with an avg rate of 99 bpm. Isolated SVEs were rare (<1.0%), SVE Couplets were rare (<1.0%), and SVE Triplets were rare (<1.0%). Isolated VEs were rare (<1.0%), and no VE Couplets or VE Triplets were present. Ventricular Trigeminy was present.  Recent Labs: 01/19/2019: BUN 9; Creatinine, Ser 0.96; Hemoglobin 14.4; Platelets 148; Potassium 4.0; Sodium 141  CBC    Component Value Date/Time   WBC 4.6 01/19/2019 0420   RBC 4.56 01/19/2019 0420   HGB 14.4 01/19/2019 0420   HGB 14.9 01/17/2019 1308   HCT 42.5 01/19/2019 0420   HCT 43.4 01/17/2019 1308   PLT 148 (L) 01/19/2019 0420   PLT 175 01/17/2019 1308   MCV 93.2 01/19/2019 0420   MCV 92 01/17/2019 1308   MCH 31.6 01/19/2019 0420   MCHC 33.9 01/19/2019 0420   RDW 13.8 01/19/2019 0420   RDW 14.8 01/17/2019 1308   LYMPHSABS 1.3 12/09/2011 0827   MONOABS 0.5 12/09/2011 0827   EOSABS 0.2 12/09/2011 0827   BASOSABS 0.0 12/09/2011 0827   CMP Latest Ref Rng & Units 01/19/2019 01/17/2019  05/28/2013  Glucose 70 - 99 mg/dL 85 87 -  BUN 6 - 20 mg/dL 9 11 -  Creatinine 5.03 - 1.24 mg/dL 5.46 5.68 -  Sodium 127 - 145 mmol/L 141 138 -  Potassium 3.5 - 5.1 mmol/L 4.0 4.4 -  Chloride 98 - 111 mmol/L  105 102 -  CO2 22 - 32 mmol/L 27 27 -  Calcium 8.9 - 10.3 mg/dL 9.1 10.4(H) -  Total Protein 6.0 - 8.3 g/dL - - 7.3  Total Bilirubin 0.3 - 1.2 mg/dL - - 1.3(H)  Alkaline Phos 39 - 117 U/L - - 80  AST 0 - 37 U/L - - 39(H)  ALT 0 - 53 U/L - - 42     Lipid Panel Lab Results  Component Value Date   CHOL 167 01/19/2019   HDL 46 01/19/2019   LDLCALC 80 01/19/2019   TRIG 204 (H) 01/19/2019   CHOLHDL 3.6 01/19/2019      Wt Readings from Last 3 Encounters:  07/23/19 209 lb (94.8 kg)  05/29/19 210 lb (95.3 kg)  02/04/19 209 lb 9.6 oz (95.1 kg)     Other studies Reviewed: Additional studies/ records that were reviewed today include: Office notes, hospital records and testing.  ASSESSMENT AND PLAN:  1.  PAF - discussed anticoagulation for CHA2DS2-VASc = 2 (HTN, CAD) - 1 episode of Afib lasted over an hour - If he cuts out BC powders, d/c ETOH and limits caffeine, he will likely decrease the duration and frequency of episodes - however, he will have it again at some point - he is agreeable to anticoag to decrease CVA risk - will start Xarelto 20 mg qd, he was given samples, 30-day free card and copay card - f/u in August  2. CAD:  - no ischemic sx - he is encouraged to increase his activity level  - continue ASA, high-dose statin, BB, Plavix  3. Hyperlipidemia - Zetia was added to his meds at last visit - ck CMET and lipids in August  4. HTN:  - BP/HR are well-controlled - continue Coreg 6.25 mg bid   Current medicines are reviewed at length with the patient today.  The patient does not have concerns regarding medicines.  The following changes have been made:  Add Xarelto  Labs/ tests ordered today include:   Orders Placed This Encounter  Procedures  .  Lipid panel  . Comprehensive metabolic panel  . EKG 12-Lead     Disposition:   FU with Verne Carrow, MD  Signed, Theodore Demark, PA-C  07/23/2019 7:47 PM    [facility] Phone: [phone]; Fax: [phone]

## 2019-11-10 ENCOUNTER — Other Ambulatory Visit: Payer: Self-pay | Admitting: Cardiovascular Disease

## 2020-01-05 ENCOUNTER — Other Ambulatory Visit: Payer: Self-pay | Admitting: Cardiovascular Disease

## 2020-02-01 ENCOUNTER — Other Ambulatory Visit: Payer: Self-pay | Admitting: Physician Assistant

## 2020-02-01 DIAGNOSIS — I480 Paroxysmal atrial fibrillation: Secondary | ICD-10-CM

## 2020-02-03 NOTE — Telephone Encounter (Signed)
Prescription refill request for Xarelto received.  Indication: afib Last office visit:07/23/19 Weight:94.8kg Age:60 Scr: 0.92 CrCl: 11mL/min

## 2020-03-17 ENCOUNTER — Emergency Department (HOSPITAL_COMMUNITY): Payer: 59

## 2020-03-17 ENCOUNTER — Other Ambulatory Visit: Payer: Self-pay

## 2020-03-17 ENCOUNTER — Encounter (HOSPITAL_COMMUNITY): Payer: Self-pay | Admitting: Emergency Medicine

## 2020-03-17 ENCOUNTER — Observation Stay (HOSPITAL_COMMUNITY): Payer: 59

## 2020-03-17 ENCOUNTER — Inpatient Hospital Stay (HOSPITAL_COMMUNITY)
Admission: EM | Admit: 2020-03-17 | Discharge: 2020-03-19 | DRG: 246 | Disposition: A | Discharge status: Home / Self Care | Payer: 59 | Attending: Cardiovascular Disease | Admitting: Cardiovascular Disease

## 2020-03-17 DIAGNOSIS — I10 Essential (primary) hypertension: Secondary | ICD-10-CM | POA: Diagnosis present

## 2020-03-17 DIAGNOSIS — Z7901 Long term (current) use of anticoagulants: Secondary | ICD-10-CM

## 2020-03-17 DIAGNOSIS — I471 Supraventricular tachycardia: Secondary | ICD-10-CM | POA: Diagnosis present

## 2020-03-17 DIAGNOSIS — E782 Mixed hyperlipidemia: Secondary | ICD-10-CM | POA: Diagnosis present

## 2020-03-17 DIAGNOSIS — E7800 Pure hypercholesterolemia, unspecified: Secondary | ICD-10-CM | POA: Diagnosis present

## 2020-03-17 DIAGNOSIS — F1010 Alcohol abuse, uncomplicated: Secondary | ICD-10-CM | POA: Diagnosis present

## 2020-03-17 DIAGNOSIS — Z8711 Personal history of peptic ulcer disease: Secondary | ICD-10-CM

## 2020-03-17 DIAGNOSIS — Z79899 Other long term (current) drug therapy: Secondary | ICD-10-CM

## 2020-03-17 DIAGNOSIS — I2510 Atherosclerotic heart disease of native coronary artery without angina pectoris: Secondary | ICD-10-CM | POA: Diagnosis present

## 2020-03-17 DIAGNOSIS — Z955 Presence of coronary angioplasty implant and graft: Secondary | ICD-10-CM

## 2020-03-17 DIAGNOSIS — Z7982 Long term (current) use of aspirin: Secondary | ICD-10-CM

## 2020-03-17 DIAGNOSIS — I214 Non-ST elevation (NSTEMI) myocardial infarction: Secondary | ICD-10-CM | POA: Diagnosis present

## 2020-03-17 DIAGNOSIS — Z20822 Contact with and (suspected) exposure to covid-19: Secondary | ICD-10-CM | POA: Diagnosis present

## 2020-03-17 DIAGNOSIS — I480 Paroxysmal atrial fibrillation: Secondary | ICD-10-CM | POA: Diagnosis present

## 2020-03-17 DIAGNOSIS — Z7902 Long term (current) use of antithrombotics/antiplatelets: Secondary | ICD-10-CM

## 2020-03-17 DIAGNOSIS — I472 Ventricular tachycardia: Secondary | ICD-10-CM | POA: Diagnosis present

## 2020-03-17 DIAGNOSIS — I25118 Atherosclerotic heart disease of native coronary artery with other forms of angina pectoris: Secondary | ICD-10-CM | POA: Diagnosis present

## 2020-03-17 DIAGNOSIS — R079 Chest pain, unspecified: Secondary | ICD-10-CM | POA: Diagnosis not present

## 2020-03-17 DIAGNOSIS — I4891 Unspecified atrial fibrillation: Secondary | ICD-10-CM

## 2020-03-17 DIAGNOSIS — T82855A Stenosis of coronary artery stent, initial encounter: Secondary | ICD-10-CM | POA: Diagnosis present

## 2020-03-17 DIAGNOSIS — Z8249 Family history of ischemic heart disease and other diseases of the circulatory system: Secondary | ICD-10-CM | POA: Diagnosis not present

## 2020-03-17 DIAGNOSIS — I252 Old myocardial infarction: Secondary | ICD-10-CM

## 2020-03-17 LAB — PROTIME-INR
INR: 1.8 — ABNORMAL HIGH (ref 0.8–1.2)
Prothrombin Time: 20.3 seconds — ABNORMAL HIGH (ref 11.4–15.2)

## 2020-03-17 LAB — CBC
HCT: 44.7 % (ref 39.0–52.0)
Hemoglobin: 15.5 g/dL (ref 13.0–17.0)
MCH: 32.2 pg (ref 26.0–34.0)
MCHC: 34.7 g/dL (ref 30.0–36.0)
MCV: 92.7 fL (ref 80.0–100.0)
Platelets: 208 10*3/uL (ref 150–400)
RBC: 4.82 MIL/uL (ref 4.22–5.81)
RDW: 13.9 % (ref 11.5–15.5)
WBC: 7.5 10*3/uL (ref 4.0–10.5)
nRBC: 0 % (ref 0.0–0.2)

## 2020-03-17 LAB — BASIC METABOLIC PANEL
Anion gap: 11 (ref 5–15)
BUN: 10 mg/dL (ref 6–20)
CO2: 28 mmol/L (ref 22–32)
Calcium: 9.9 mg/dL (ref 8.9–10.3)
Chloride: 101 mmol/L (ref 98–111)
Creatinine, Ser: 1.05 mg/dL (ref 0.61–1.24)
GFR, Estimated: >60 mL/min (ref >60)
Glucose, Bld: 94 mg/dL (ref 70–99)
Potassium: 4.5 mmol/L (ref 3.5–5.1)
Sodium: 140 mmol/L (ref 135–145)

## 2020-03-17 LAB — TROPONIN I (HIGH SENSITIVITY)
Troponin I (High Sensitivity): 97 ng/L — ABNORMAL HIGH (ref <18)
Troponin I (High Sensitivity): 1224 ng/L (ref <18)

## 2020-03-17 LAB — MAGNESIUM: Magnesium: 2.1 mg/dL (ref 1.7–2.4)

## 2020-03-17 LAB — RESP PANEL BY RT-PCR (FLU A&B, COVID) ARPGX2
Influenza A by PCR: NEGATIVE
Influenza B by PCR: NEGATIVE
SARS Coronavirus 2 by RT PCR: NEGATIVE

## 2020-03-17 MED ORDER — ACETAMINOPHEN 325 MG PO TABS: 650.0000 mg | ORAL_TABLET | ORAL | Status: DC | PRN

## 2020-03-17 MED ORDER — AMIODARONE HCL IN DEXTROSE 360-4.14 MG/200ML-% IV SOLN
30.0000 mg/h | INTRAVENOUS | Status: DC
  Administered 2020-03-17 – 2020-03-19 (×4): 30 mg/h via INTRAVENOUS
  Filled 2020-03-17 (×6): qty 200

## 2020-03-17 MED ORDER — ASPIRIN 81 MG PO CHEW
324.0000 mg | CHEWABLE_TABLET | ORAL | Status: AC
  Filled 2020-03-17: qty 4

## 2020-03-17 MED ORDER — EZETIMIBE 10 MG PO TABS
10.0000 mg | ORAL_TABLET | Freq: Every day | ORAL | Status: DC
  Administered 2020-03-17 – 2020-03-19 (×3): 10 mg via ORAL
  Filled 2020-03-17 (×3): qty 1

## 2020-03-17 MED ORDER — AMIODARONE LOAD VIA INFUSION
300.0000 mg | Freq: Once | INTRAVENOUS | Status: AC
  Administered 2020-03-17: 300 mg via INTRAVENOUS
  Filled 2020-03-17: qty 166.67

## 2020-03-17 MED ORDER — NITROGLYCERIN 0.4 MG SL SUBL: 0.4000 mg | SUBLINGUAL_TABLET | SUBLINGUAL | Status: DC | PRN

## 2020-03-17 MED ORDER — ASPIRIN 300 MG RE SUPP: 300.0000 mg | RECTAL | Status: AC

## 2020-03-17 MED ORDER — HEPARIN (PORCINE) 25000 UT/250ML-% IV SOLN
1400.0000 [IU]/h | INTRAVENOUS | Status: DC
  Administered 2020-03-17: 1400 [IU]/h via INTRAVENOUS
  Filled 2020-03-17 (×2): qty 250

## 2020-03-17 MED ORDER — AMIODARONE HCL IN DEXTROSE 360-4.14 MG/200ML-% IV SOLN
60.0000 mg/h | INTRAVENOUS | Status: AC
  Administered 2020-03-17: 60 mg/h via INTRAVENOUS
  Filled 2020-03-17 (×2): qty 200

## 2020-03-17 MED ORDER — ASPIRIN EC 81 MG PO TBEC
81.0000 mg | DELAYED_RELEASE_TABLET | Freq: Every day | ORAL | Status: DC
  Administered 2020-03-18 – 2020-03-19 (×2): 81 mg via ORAL
  Filled 2020-03-17 (×2): qty 1

## 2020-03-17 MED ORDER — CARVEDILOL 6.25 MG PO TABS
6.2500 mg | ORAL_TABLET | Freq: Two times a day (BID) | ORAL | Status: DC
  Administered 2020-03-17 – 2020-03-19 (×4): 6.25 mg via ORAL
  Filled 2020-03-17 (×4): qty 1

## 2020-03-17 MED ORDER — ASPIRIN 81 MG PO CHEW
324.0000 mg | CHEWABLE_TABLET | Freq: Once | ORAL | Status: AC
  Administered 2020-03-17: 324 mg via ORAL
  Filled 2020-03-17: qty 4

## 2020-03-17 MED ORDER — ONDANSETRON HCL 4 MG/2ML IJ SOLN: 4.0000 mg | Freq: Four times a day (QID) | INTRAMUSCULAR | Status: DC | PRN

## 2020-03-17 MED ORDER — ATORVASTATIN CALCIUM 80 MG PO TABS
80.0000 mg | ORAL_TABLET | Freq: Every day | ORAL | Status: DC
  Administered 2020-03-17 – 2020-03-19 (×3): 80 mg via ORAL
  Filled 2020-03-17 (×3): qty 1

## 2020-03-17 MED ORDER — CLOPIDOGREL BISULFATE 75 MG PO TABS
75.0000 mg | ORAL_TABLET | Freq: Every day | ORAL | Status: DC
  Administered 2020-03-17 – 2020-03-18 (×2): 75 mg via ORAL
  Filled 2020-03-17 (×2): qty 1

## 2020-03-17 MED ORDER — ASPIRIN 81 MG PO CHEW: 324.0000 mg | CHEWABLE_TABLET | Freq: Once | ORAL | Status: DC

## 2020-03-17 NOTE — Progress Notes (Signed)
Attempted echo. Patient not in room.

## 2020-03-17 NOTE — ED Triage Notes (Signed)
Patient reports left lower chest pain radiating to both arms this evening , mild SOB , no emesis or diaphoresis , history of CAD with coronary stents , no cough or fever .

## 2020-03-17 NOTE — ED Provider Notes (Signed)
[name] EMERGENCY DEPARTMENT Provider Note   CSN: [id number] Arrival date & time: 03/17/20  0044     History Chief Complaint  Patient presents with  . Chest Pain    Joseph [name] is a 60 y.o. male with a history of STEMI 3/9 s/p BMS, LAD, cath 2/13 s/p DES, OM1, cath 12/20 with DES OM (med Rx for moderate disease RCA and mild disease circum flex, PAF on Xarelto, HTN, HLD, Etoh use, PUD who presents the emergency department from home with a chief complaint of chest pain.  The patient endorses sudden onset, left-sided chest pain that he characterizes as aching that began at approximately 19:00-19:15.  There was some radiation into his bilateral neck. He reports that he was overall feeling poorly and the pain was accompanied by nausea and aching and pain in his bilateral upper arms, which he states that he has previously had with prior cardiac events.  He denies diaphoresis, shortness of breath, palpitations, leg swelling, syncope, vomiting, visual changes, headache, abdominal pain, back pain, numbness, or weakness.  He initially took NTG x2 with some improvement in his symptoms.  However, when he laid down to go to bed his pain worsened and continued to worsen when he stood up so he took two more NTG with [name] improvement in his symptoms, which prompted his visit to the ER.  While waiting in the waiting room, he reports that his chest and arm pain resolved as well as the nausea.  Reports that he is feeling back to his baseline.  He has been compliant with his home Xarelto, last dose was tonight.   Cardiologist: Dr. Clifton James   The history is provided by the patient and medical records. [name] language interpreter was used.       Past Medical History:  Diagnosis Date  . Coronary artery disease    a. 04/2007 - Ant Stemi: LM: nl, LAD: 100p ( 2.75 x 28 Vision BMS ), LCX: 70-75, RI: 40/50, RCA: 27m, EF 35-40%.;  b. 01/2011 - Abnl ETT (Inf-Lat ST dep);  c.  02/2011 - Ex MV:   Dist ant-sept Infarct.  [name] ischemia.;  d. 03/30/11 NSTEMI - Cath: 99 LCX into OM1, otw nonobs dzs & NL LV - LCX stented w/ 2 promus DES  . Habitual alcohol use    a. at least a 12 pack/wk (03/30/2011)  . Hypercholesteremia   . Hypertension   . PUD (peptic ulcer disease)    in his 73s    Patient Active Problem List   Diagnosis Date Noted  . Abnormal stress test 01/18/2019  . Coronary artery disease with angina pectoris (HCC) 01/18/2019  . NSTEMI (non-ST elevated myocardial infarction) (HCC) 03/30/2011  . Coronary artery disease   . HYPERCHOLESTEROLEMIA 08/30/2008  . Essential hypertension 08/30/2008    Past Surgical History:  Procedure Laterality Date  . CARDIAC CATHETERIZATION  01/18/2019  . CORONARY STENT INTERVENTION  01/18/2019  . CORONARY STENT INTERVENTION N/A 01/18/2019   Procedure: CORONARY STENT INTERVENTION;  Surgeon: Yvonne Kendall, MD;  Location: [facility] INVASIVE CV LAB;  Service: Cardiovascular;  Laterality: N/A;  . INTRAVASCULAR PRESSURE WIRE/FFR STUDY N/A 01/18/2019   Procedure: INTRAVASCULAR PRESSURE WIRE/FFR STUDY;  Surgeon: Yvonne Kendall, MD;  Location: [facility] INVASIVE CV LAB;  Service: Cardiovascular;  Laterality: N/A;  . LEFT HEART CATH AND CORONARY ANGIOGRAPHY N/A 01/18/2019   Procedure: LEFT HEART CATH AND CORONARY ANGIOGRAPHY;  Surgeon: Yvonne Kendall, MD;  Location: [facility] INVASIVE CV LAB;  Service: Cardiovascular;  Laterality: N/A;  .  LEFT HEART CATHETERIZATION WITH CORONARY ANGIOGRAM N/A 03/30/2011   Procedure: LEFT HEART CATHETERIZATION WITH CORONARY ANGIOGRAM;  Surgeon: Kathleene Hazel, MD;  Location: [facility] CATH LAB;  Service: Cardiovascular;  Laterality: N/A;  . None         Family History  Problem Relation Age of Onset  . Coronary artery disease Mother 6       alive @ 63  . Coronary artery disease Father        had MI in his 56's - died @ 70  . Heart attack Brother 51       alive - 63    Social History   Tobacco Use  . Smoking status: Never  Smoker  . Smokeless tobacco: Never Used  Vaping Use  . Vaping Use: Never used  Substance Use Topics  . Alcohol use: Yes    Alcohol/week: 10.0 standard drinks    Types: 10 Standard drinks or equivalent per week    Comment: 2 six packs a week, mostly on the weekends  . Drug use: [name]    Home Medications Prior to Admission medications   Medication Sig Start Date End Date Taking? Authorizing Provider  aspirin 81 MG tablet Take 81 mg by mouth daily.    [provider]  atorvastatin (LIPITOR) 80 MG tablet TAKE 1 TABLET BY MOUTH EVERY DAY 11/12/19   Kathleene Hazel, MD  b complex vitamins tablet Take 1 tablet by mouth daily.     [provider]  calcium carbonate (TUMS - DOSED IN MG ELEMENTAL CALCIUM) 500 MG chewable tablet Chew 1 tablet by mouth 3 (three) times daily as needed for indigestion or heartburn.    [provider]  Calcium-Magnesium-Vitamin D (CALCIUM MAGNESIUM PO) Take 1 tablet by mouth daily.    [provider]  carvedilol (COREG) 6.25 MG tablet TAKE 1 TABLET (6.25 MG TOTAL) BY MOUTH 2 (TWO) TIMES DAILY WITH A MEAL. 02/14/19   Kathleene Hazel, MD  cetirizine (ZYRTEC) 10 MG tablet Take 10 mg by mouth daily.    [provider]  clopidogrel (PLAVIX) 75 MG tablet TAKE 1 TABLET BY MOUTH EVERY DAY 01/06/20   Kathleene Hazel, MD  Coenzyme Q10 (COQ-10) 100 MG CAPS Take 100 mg by mouth daily.    [provider]  ezetimibe (ZETIA) 10 MG tablet Take 1 tablet (10 mg total) by mouth daily. 05/29/19   Kathleene Hazel, MD  famotidine (PEPCID) 20 MG tablet Take 20 mg by mouth daily.    [provider]  FLAXSEED-EVE PRIM-BORAGE PO Take 1 capsule by mouth daily.     [provider]  Multiple Vitamin (MULTIVITAMIN) tablet Take 1 tablet by mouth daily.    [provider]  nitroGLYCERIN (NITROSTAT) 0.4 MG SL tablet PLACE 1 TABLET UNDER THE TONGUE EVERY 5 (FIVE) MINUTES AS NEEDED FOR CHEST PAIN.  12/19/18   Kathleene Hazel, MD  XARELTO 20 MG TABS tablet TAKE 1 TABLET (20 MG TOTAL) BY MOUTH DAILY WITH SUPPER. 02/03/20   Kathleene Hazel, MD  zinc gluconate 50 MG tablet Take 50 mg by mouth daily.    [provider]    Allergies    Patient has [name] known allergies.  Review of Systems   Review of Systems  Constitutional: Negative for appetite change and fever.  HENT: Negative for congestion and sore throat.   Eyes: Negative for visual disturbance.  Respiratory: Negative for shortness of breath and wheezing.   Cardiovascular: Negative for chest  pain and palpitations.  Gastrointestinal: Positive for nausea. Negative for abdominal pain, anal bleeding, blood in stool, constipation, diarrhea and vomiting.  Genitourinary: Negative for dysuria.  Musculoskeletal: Positive for myalgias and neck pain. Negative for arthralgias, back pain, gait problem and neck stiffness.  Skin: Negative for color change, rash and wound.  Allergic/Immunologic: Negative for immunocompromised state.  Neurological: Negative for syncope, weakness and headaches.  Psychiatric/Behavioral: Negative for confusion.    Physical Exam Updated Vital Signs BP (!) 141/98   Pulse 86   Temp 98.5 F (36.9 C) (Oral)   Resp (!) 28   Ht 5\' 10"  (1.778 m)   Wt 112 kg   SpO2 95%   BMI 35.43 kg/m   Physical Exam Vitals and nursing note reviewed.  Constitutional:      General: He is not in acute distress.    Appearance: He is well-developed. He is not ill-appearing, toxic-appearing or diaphoretic.  HENT:     Head: Normocephalic.  Eyes:     Conjunctiva/sclera: Conjunctivae normal.  Cardiovascular:     Rate and Rhythm: Normal rate and regular rhythm.     Heart sounds: [name] murmur heard.     Comments: Peripheral pulses are 2+ and symmetric. Pulmonary:     Effort: Pulmonary effort is normal. [name] respiratory distress.     Breath sounds: [name] stridor. [name] wheezing, rhonchi or rales.  Chest:     Chest  wall: [name] tenderness.  Abdominal:     General: There is [name] distension.     Palpations: Abdomen is soft. There is [name] mass.     Tenderness: There is [name] abdominal tenderness. There is [name] right CVA tenderness, left CVA tenderness, guarding or rebound.     Hernia: [name] hernia is present.  Musculoskeletal:     Cervical back: Neck supple.  Skin:    General: Skin is warm and dry.  Neurological:     Mental Status: He is alert.  Psychiatric:        Behavior: Behavior normal.     ED Results / Procedures / Treatments   Labs (all labs ordered are listed, but only abnormal results are displayed) Labs Reviewed  PROTIME-INR - Abnormal; Notable for the following components:      Result Value   Prothrombin Time 20.3 (*)    INR 1.8 (*)    All other components within normal limits  TROPONIN I (HIGH SENSITIVITY) - Abnormal; Notable for the following components:   Troponin I (High Sensitivity) 97 (*)    All other components within normal limits  TROPONIN I (HIGH SENSITIVITY) - Abnormal; Notable for the following components:   Troponin I (High Sensitivity) 1,224 (*)    All other components within normal limits  RESP PANEL BY RT-PCR (FLU A&B, COVID) ARPGX2  BASIC METABOLIC PANEL  CBC  MAGNESIUM    EKG EKG Interpretation  Date/Time:  Tuesday March 17 2020 06:59:58 EST Ventricular Rate:  83 PR Interval:    QRS Duration: 95 QT Interval:  379 QTC Calculation: 446 R Axis:   82 Text Interpretation: Sinus rhythm Confirmed by 02-24[birth year]1988, April (Nicanor Alcon) on 03/17/2020 7:04:49 AM   Radiology DG Chest 2 View  Result Date: 03/17/2020 CLINICAL DATA:  Left-sided chest pain.  Shortness of breath. EXAM: CHEST - 2 VIEW COMPARISON:  Remote radiograph 04/19/2007 FINDINGS: The cardiomediastinal contours are normal. Coronary stents visualized. The lungs are clear. Pulmonary vasculature is normal. [name] consolidation, pleural effusion, or pneumothorax. [name] acute osseous abnormalities are seen. IMPRESSION: [name] acute  chest findings.  Electronically Signed   By: Narda Rutherford M.D.   On: 03/17/2020 01:15    Procedures .Critical Care Performed by: Barkley Boards, PA-C Authorized by: Barkley Boards, PA-C   Critical care provider statement:    Critical care time (minutes):  60   Critical care time was exclusive of:  Separately billable procedures and treating other patients and teaching time   Critical care was necessary to treat or prevent imminent or life-threatening deterioration of the following conditions:  Cardiac failure   Critical care was time spent personally by me on the following activities:  Ordering and performing treatments and interventions, ordering and review of laboratory studies, ordering and review of radiographic studies, pulse oximetry, re-evaluation of patient's condition, review of old charts, obtaining history from patient or surrogate, examination of patient, evaluation of patient's response to treatment, discussions with consultants and development of treatment plan with patient or surrogate   I assumed direction of critical care for this patient from another provider in my specialty: [name]     Care discussed with: admitting provider       Medications Ordered in ED Medications  heparin ADULT infusion 100 units/mL (25000 units/216mL) (has [name] administration in time range)  amiodarone (NEXTERONE) 1.8 mg/mL load via infusion 300 mg (300 mg Intravenous Bolus from Bag 03/17/20 0701)    Followed by  amiodarone (NEXTERONE PREMIX) 360-4.14 MG/200ML-% (1.8 mg/mL) IV infusion (has [name] administration in time range)    Followed by  amiodarone (NEXTERONE PREMIX) 360-4.14 MG/200ML-% (1.8 mg/mL) IV infusion (has [name] administration in time range)  aspirin chewable tablet 324 mg (324 mg Oral Given 03/17/20 0540)    ED Course  I have reviewed the triage vital signs and the nursing notes.  Pertinent labs & imaging results that were available during my care of the patient were reviewed by me and  considered in my medical decision making (see chart for details).  Clinical Course as of 03/17/20 0718  Tue Mar 17, 2020  0559 Troponin I (High Sensitivity)(!!): 1,224 [MM]  0600 Notified by RN [MM]  [phone] Notified by RN that patient had a run of sustained ventricular tachycardia follow by a BBB on the cardiac monitor.  Cardiac monitor was reviewed by me and patient had approximately 30 beats of sustained ventricular tachycardia.  See rhythm strip above.  On reevaluation, patient is in normal sinus rhythm.  He denies any pain associated with this episode. [MM]    Clinical Course User Index [MM] Alvy Bimler        MDM Rules/Calculators/A&P                          60 year old male with a history of STEMI 3/9 s/p BMS, LAD, cath 2/13 s/p DES, OM1, cath 12/20 with DES OM (med Rx for moderate disease RCA and mild disease circum flex, PAF on Xarelto, HTN, HLD, Etoh use, PUD who presents to the emergency department with chest pain and bilateral upper arm pain that began earlier tonight.  Patient stated that pain in his bilateral arms felt similar to previous NSTEMI.  He initially had improvement with NTG x2, but when pain worsened and he attempted to more he had [name] improvement in his symptoms.  Labs and imaging has been reviewed and independently interpreted by me.  EKG with atrial fibrillation with RVR.  Chest x-ray is unremarkable.  [name] metabolic derangements.  Initial troponin 97 --> 1224.   Patient was seen and  independently evaluated by Dr. Nicanor AlconPalumbo, attending physician.  Consult to cardiology and spoke with Dr. Meredeth Ideoniglio who will see and evaluate the patient.  She is requesting COVID-19 test for the patient prior to determine admission placement.  - 06:15 patient had an episode of sustained ventricular tachycardia with a BBB.  Episode lasted for approximately 30 beats before converting to NSR.  Discussed with Dr. Nicanor AlconPalumbo.  Amiodarone bolus and infusion initiated.  We consulted  cardiology and spoke with Albesa SeenLIndsay Robert, NP, who will see and evaluate the patient.  Patient will likely be admitted to cardiology service; however, Rhea BleacherJosh Geiple, ER PA, has been made aware of the patient at sign out if medical admission is needed.  The patient appears reasonably stabilized for admission considering the current resources, flow, and capabilities available in the ED at this time, and I doubt any other [facility] requiring further screening and/or treatment in the ED prior to admission.  Final Clinical Impression(s) / ED Diagnoses Final diagnoses:  NSTEMI (non-ST elevated myocardial infarction) [facility](HCC)  Atrial fibrillation with RVR [facility](HCC)    Rx / DC Orders ED Discharge Orders    None       Barkley BoardsMcDonald, Bingham Millette A, PA-C 03/17/20 0718    Palumbo, April, MD 03/17/20 [id number]

## 2020-03-17 NOTE — H&P (Addendum)
Cardiology Admission History and Physical:   Patient ID: [name] MRN: [id number]; DOB: 17-May-1960   Admission date: 03/17/2020  PCP:  Verlon Au, MD   [facility]  Cardiologist:  Verne Carrow, MD  Advanced Practice Provider:  No care team member to display Electrophysiologist:  None   Chief Complaint:  Chest pain  Patient Profile:   [name] is a 60 y.o. male with STEMI 2009 status post bare-metal stent to LAD, cath 2013 with overlapping DES to OM1, cath 2020 with DES to RI, hypertension, hyperlipidemia, EtOH and PAF use who presented with chest pain.   History of Present Illness:   [name] is a 60 year old male with past medical history noted above. He is followed by Dr. Clifton James as an outpatient. He has a history of anterior STEMI treated with bare-metal stent to proximal LAD 04/2007. He was admitted in 2013 with unstable angina underwent cath with severe stenosis in OM1 treated with overlapping DES. There is residual stenosis of a diagonal vessel 50% and 20% RCA. He presented with stable angina in November 2020 and underwent stress testing which was noted to have high risk findings. Underwent cardiac cath showing 70 to 80% stenosis in the ramus intermediate successfully treated with PCI/DES. Did have residual 40% stenosis in the proximal circumflex and 60% stenosis of the RCA found to be not hemodynamically significant by DFR. Recommendations were to continue with medical therapy, continue DAPT with aspirin and Plavix indefinitely with aggressive secondary prevention.  He was last seen in the office on 07/2019 after recently wearing a ZIO monitor. Review of results showed episodes of SVT and atrial fibrillation. He was started on Xarelto 20 mg daily. Continued on other home medications without significant change.  He presented to the ER on 2/14 with episodes of chest pain. States he was able to work that day came home and ate dinner.  Shortly afterwards he began to have a chest ache sensation in the left side of his chest with radiation into his arms bilaterally. States is very similar to what he had with prior episodes which required stenting. Also felt like his heart was "out of rhythm" but not necessarily palpitations. Did have some shortness of breath. Symptoms persisted for several hours and he eventually presented to the ED for further evaluation.  He is labs showed stable electrolytes, creatinine 1.05, high-sensitivity troponin 97>> 1224, WBC 7.5, hemoglobin 15.5. Covid negative. Chest x-ray negative. Initial EKG showed atrial fibrillation with RVR, 137 bpm with ST depression in lateral leads. Follow-up EKG showed conversion to sinus rhythm with no acute ST/T wave abnormalities. While in the ED he had an episode of 30 beats of nonsustained VT, reported asymptomatic. He was started on IV amiodarone. Radiology has been called for admission. Of note patient reports his last dose of Eliquis was 8 PM the evening of 2/14.   Past Medical History:  Diagnosis Date  . Coronary artery disease    a. 04/2007 - Ant Stemi: LM: nl, LAD: 100p ( 2.75 x 28 Vision BMS ), LCX: 70-75, RI: 40/50, RCA: 34m, EF 35-40%.;  b. 01/2011 - Abnl ETT (Inf-Lat ST dep);  c.  02/2011 - Ex MV:  Dist ant-sept Infarct.  No ischemia.;  d. 03/30/11 NSTEMI - Cath: 99 LCX into OM1, otw nonobs dzs & NL LV - LCX stented w/ 2 promus DES  . Habitual alcohol use    a. at least a 12 pack/wk (03/30/2011)  . Hypercholesteremia   .  Hypertension   . PUD (peptic ulcer disease)    in his 37s    Past Surgical History:  Procedure Laterality Date  . CARDIAC CATHETERIZATION  01/18/2019  . CORONARY STENT INTERVENTION  01/18/2019  . CORONARY STENT INTERVENTION N/A 01/18/2019   Procedure: CORONARY STENT INTERVENTION;  Surgeon: Yvonne Kendall, MD;  Location: [facility] INVASIVE CV LAB;  Service: Cardiovascular;  Laterality: N/A;  . INTRAVASCULAR PRESSURE WIRE/FFR STUDY N/A 01/18/2019    Procedure: INTRAVASCULAR PRESSURE WIRE/FFR STUDY;  Surgeon: Yvonne Kendall, MD;  Location: [facility] INVASIVE CV LAB;  Service: Cardiovascular;  Laterality: N/A;  . LEFT HEART CATH AND CORONARY ANGIOGRAPHY N/A 01/18/2019   Procedure: LEFT HEART CATH AND CORONARY ANGIOGRAPHY;  Surgeon: Yvonne Kendall, MD;  Location: [facility] INVASIVE CV LAB;  Service: Cardiovascular;  Laterality: N/A;  . LEFT HEART CATHETERIZATION WITH CORONARY ANGIOGRAM N/A 03/30/2011   Procedure: LEFT HEART CATHETERIZATION WITH CORONARY ANGIOGRAM;  Surgeon: Kathleene Hazel, MD;  Location: [facility] CATH LAB;  Service: Cardiovascular;  Laterality: N/A;  . None       Medications Prior to Admission: Prior to Admission medications   Medication Sig Start Date End Date Taking? Authorizing Provider  aspirin 81 MG tablet Take 81 mg by mouth daily.    [provider]  atorvastatin (LIPITOR) 80 MG tablet TAKE 1 TABLET BY MOUTH EVERY DAY 11/12/19   Kathleene Hazel, MD  b complex vitamins tablet Take 1 tablet by mouth daily.     [provider]  calcium carbonate (TUMS - DOSED IN MG ELEMENTAL CALCIUM) 500 MG chewable tablet Chew 1 tablet by mouth 3 (three) times daily as needed for indigestion or heartburn.    [provider]  Calcium-Magnesium-Vitamin D (CALCIUM MAGNESIUM PO) Take 1 tablet by mouth daily.    [provider]  carvedilol (COREG) 6.25 MG tablet TAKE 1 TABLET (6.25 MG TOTAL) BY MOUTH 2 (TWO) TIMES DAILY WITH A MEAL. 02/14/19   Kathleene Hazel, MD  cetirizine (ZYRTEC) 10 MG tablet Take 10 mg by mouth daily.    [provider]  clopidogrel (PLAVIX) 75 MG tablet TAKE 1 TABLET BY MOUTH EVERY DAY 01/06/20   Kathleene Hazel, MD  Coenzyme Q10 (COQ-10) 100 MG CAPS Take 100 mg by mouth daily.    [provider]  ezetimibe (ZETIA) 10 MG tablet Take 1 tablet (10 mg total) by mouth daily. 05/29/19   Kathleene Hazel, MD  famotidine (PEPCID) 20 MG tablet Take 20 mg by  mouth daily.    [provider]  FLAXSEED-EVE PRIM-BORAGE PO Take 1 capsule by mouth daily.     [provider]  Multiple Vitamin (MULTIVITAMIN) tablet Take 1 tablet by mouth daily.    [provider]  nitroGLYCERIN (NITROSTAT) 0.4 MG SL tablet PLACE 1 TABLET UNDER THE TONGUE EVERY 5 (FIVE) MINUTES AS NEEDED FOR CHEST PAIN. 12/19/18   Kathleene Hazel, MD  XARELTO 20 MG TABS tablet TAKE 1 TABLET (20 MG TOTAL) BY MOUTH DAILY WITH SUPPER. 02/03/20   Kathleene Hazel, MD  zinc gluconate 50 MG tablet Take 50 mg by mouth daily.    [provider]     Allergies:   No Known Allergies  Social History:   Social History   Socioeconomic History  . Marital status: Single    Spouse name: Not on file  . Number of children: 0  . Years of education: Not on file  . Highest education level: Not on file  Occupational History  .  Occupation: Designer, television/film set: [employer]  Tobacco Use  . Smoking status: Never Smoker  . Smokeless tobacco: Never Used  Vaping Use  . Vaping Use: Never used  Substance and Sexual Activity  . Alcohol use: Yes    Alcohol/week: 10.0 standard drinks    Types: 10 Standard drinks or equivalent per week    Comment: 2 six packs a week, mostly on the weekends  . Drug use: No  . Sexual activity: Not on file  Other Topics Concern  . Not on file  Social History Narrative   Lives in [place] by himself.  Works locally as a Stage manager.   Social Determinants of Health   Financial Resource Strain: Not on file  Food Insecurity: Not on file  Transportation Needs: Not on file  Physical Activity: Not on file  Stress: Not on file  Social Connections: Not on file  Intimate Partner Violence: Not on file    Family History:   The patient's family history includes Coronary artery disease in his father; Coronary artery disease (age of onset: 108) in his mother; Heart attack (age of onset: 88) in his brother.     ROS:  Please see the history of present illness.  All other ROS reviewed and negative.     Physical Exam/Data:   Vitals:   03/17/20 0800 03/17/20 0815 03/17/20 0830 03/17/20 0845  BP: (!) 138/99 (!) 138/102 (!) 128/95 (!) 131/99  Pulse: 74 79 73 74  Resp: (!) 23 (!) 22 (!) 25 16  Temp:      TempSrc:      SpO2: 97% 98% 98% 98%  Weight:      Height:       No intake or output data in the 24 hours ending 03/17/20 0925 Last 3 Weights 03/17/2020 07/23/2019 05/29/2019  Weight (lbs) 246 lb 14.6 oz 209 lb 210 lb  Weight (kg) 112 kg 94.802 kg 95.255 kg     Body mass index is 35.43 kg/m.  General:  Well nourished, well developed, in no acute distress HEENT: normal Lymph: no adenopathy Neck: no JVD Endocrine:  No thryomegaly Vascular: No carotid bruits; FA pulses 2+ bilaterally without bruits  Cardiac:  normal S1, S2; RRR; no murmur  Lungs:  clear to auscultation bilaterally, no wheezing, rhonchi or rales  Abd: soft, nontender, no hepatomegaly  Ext: no edema Musculoskeletal:  No deformities, BUE and BLE strength normal and equal Skin: warm and dry  Neuro:  CNs 2-12 intact, no focal abnormalities noted Psych:  Normal affect    EKG: Initial EKG showed atrial fibrillation, 137 bpm with ST depression in lateral leads  Subsequent EKG shows conversion to sinus rhythm with no acute ST/T wave abnormalities  Relevant CV Studies:  Echo: 01/2019  IMPRESSIONS    1. Left ventricular ejection fraction, by visual estimation, is 60 to  65%. The left ventricle has normal function. Left ventricular septal wall  thickness was normal. Mildly increased left ventricular posterior wall  thickness. There is mildly increased  left ventricular hypertrophy.  2. Global right ventricle has normal systolic function.The right  ventricular size is normal. No increase in right ventricular wall  thickness.  3. Left atrial size was normal.  4. Right atrial size was normal.  5. The mitral valve is  normal in structure. No evidence of mitral valve  regurgitation. No evidence of mitral stenosis.  6. The tricuspid valve is normal in structure. Tricuspid valve  regurgitation is trivial.  7. The aortic valve is normal in structure. Aortic valve regurgitation is  not visualized. No evidence of aortic valve sclerosis or stenosis.  8. The pulmonic valve was normal in structure. Pulmonic valve  regurgitation is not visualized.  9. Normal pulmonary artery systolic pressure.  10. The inferior vena cava is normal in size with greater than 50%  respiratory variability, suggesting right atrial pressure of 3 mmHg.    Cardiac catheterization: 01/2019  Conclusions: 1. Multivessel coronary artery disease, including 70-80% ramus intermedius stenosis, 40% proximal LCx disease, and 60% mid RCA lesion (not hemodynamically significant by DFR). 2. Patent LAD and LCx stents.  There is mild in-stent restenosis involving the proximal/mid LAD. 3. Mildly elevated left ventricular filling pressure. 4. Successful PCI to ramus intermedius using Synergy 2.25 x 24 mm drug-eluting stent with 0% residual stenosis and TIMI-3 flow.  Recommendations: 1. Overnight extended recovery, as the patient does not have a caregiver to assist him at home today. 2. Continue indefinite dual antiplatelet therapy with aspirin and clopidogrel. 3. Aggressive secondary prevention.  Yvonne Kendallhristopher End, MD [facility]  Diagnostic Dominance: Right    Intervention      Laboratory Data:  High Sensitivity Troponin:   Recent Labs  Lab 03/17/20 0107 03/17/20 0329  TROPONINIHS 97* 1,224*      Chemistry Recent Labs  Lab 03/17/20 0107  NA 140  K 4.5  CL 101  CO2 28  GLUCOSE 94  BUN 10  CREATININE 1.05  CALCIUM 9.9  GFRNONAA >60  ANIONGAP 11    No results for input(s): PROT, ALBUMIN, AST, ALT, ALKPHOS, BILITOT in the last 168 hours. Hematology Recent Labs  Lab 03/17/20 0107  WBC 7.5  RBC 4.82  HGB 15.5   HCT 44.7  MCV 92.7  MCH 32.2  MCHC 34.7  RDW 13.9  PLT 208   BNPNo results for input(s): BNP, PROBNP in the last 168 hours.  DDimer No results for input(s): DDIMER in the last 168 hours.   Radiology/Studies:  DG Chest 2 View  Result Date: 03/17/2020 CLINICAL DATA:  Left-sided chest pain.  Shortness of breath. EXAM: CHEST - 2 VIEW COMPARISON:  Remote radiograph 04/19/2007 FINDINGS: The cardiomediastinal contours are normal. Coronary stents visualized. The lungs are clear. Pulmonary vasculature is normal. No consolidation, pleural effusion, or pneumothorax. No acute osseous abnormalities are seen. IMPRESSION: No acute chest findings. Electronically Signed   By: Narda RutherfordMelanie  Sanford M.D.   On: 03/17/2020 01:15     Assessment and Plan:   [name] is a 60 y.o. male with STEMI 2009 status post bare-metal stent to LAD, cath 2013 with overlapping DES to OM1, cath 2020 with DES to RI, hypertension, hyperlipidemia, EtOH and PAF use who presented with chest pain.   1. Non-STEMI: High-sensitivity troponin rose to 1224. EKG on arrival showed atrial fibrillation with RVR with ST depression in lateral leads. After converting to sinus rhythm ST depression resolved. Patient does report symptoms are very similar to prior cardiac episodes requiring stenting. -- Will need to undergo cardiac catheterization to redefine coronary anatomy, but last dose of Xarelto was 2/14 8 PM. -- Will start IV heparin in the interim -- Plan for cardiac cath tomorrow --Continue aspirin, Plavix, statin and beta-blocker --Check echo  2.  PAF: Initial EKG showed atrial fibrillation with RVR.  Converted to sinus rhythm while in the ER. --As above last dose of Xarelto 8 PM 2/14, will start IV heparin in anticipation for cardiac cath -- continue coreg 6.25mg  BID  3.  Nonsustained VT: Noted to have 30 beat run on telemetry, patient reported asymptomatic --Has been started on IV amiodarone -- no further episodes noted, will  review with MD regarding ongoing therapy  4.  Hypertension: Continue carvedilol 6.25 mg twice daily  5.  Hyperlipidemia: On atorvastatin 80 mg daily, Zetia 10 mg daily -- check lipids  Risk Assessment/Risk Scores:   TIMI Risk Score for Unstable Angina or Non-ST Elevation MI:   The patient's TIMI risk score is 4, which indicates a 20% risk of all cause mortality, new or recurrent myocardial infarction or need for urgent revascularization in the next 14 days.   CHA2DS2-VASc Score = 2  This indicates a 2.2% annual risk of stroke. The patient's score is based upon: CHF History: No HTN History: Yes Diabetes History: No Stroke History: No Vascular Disease History: Yes Age Score: 0 Gender Score: 0   Severity of Illness: The appropriate patient status for this patient is OBSERVATION. Observation status is judged to be reasonable and necessary in order to provide the required intensity of service to ensure the patient's safety. The patient's presenting symptoms, physical exam findings, and initial radiographic and laboratory data in the context of their medical condition is felt to place them at decreased risk for further clinical deterioration. Furthermore, it is anticipated that the patient will be medically stable for discharge from the hospital within 2 midnights of admission. The following factors support the patient status of observation.   " The patient's presenting symptoms include chest pain, shortness of breath. " The physical exam findings include stable exam. " The initial radiographic and laboratory data are elevated troponin.     For questions or updates, please contact [facility] Please consult [email or web address] for contact info under     Signed, Laverda Page, NP  03/17/2020 9:25 AM   Agree with note by Laverda Page NP-C  [name] is admitted last night with non-STEMI.  He is a patient of Dr. Gibson Ramp.  He has had myocardial infarction back in 2009 and  several stents since that time most recently by Dr. Okey Dupre 12/20.  He has a stent in his LAD, ramus and circumflex with a 60% mid to distal RCA lesion.  He also has PAF on Xarelto.  His other problems include treated hypertension hyperlipidemia.  He had chest pain last night at home minimally responsive disability glycerin.  He was in A. fib.  On presentation to the ER he was in A. fib which converted to sinus rhythm.  He is on IV amiodarone.  He did have a 30 beat run of nonsustained ventricular tachycardia and has normal electrolytes.  His troponins rose to 1200.  His last dose of Xarelto was last night.  His exam is benign.  He will need diagnostic coronary angiography tomorrow.  We will start IV heparin later today.  Runell Gess, M.D., FACP, [facility], [facility] [address] [place], [place]  [address]  [phone] 03/17/2020 9:51 AM

## 2020-03-17 NOTE — Progress Notes (Signed)
ANTICOAGULATION CONSULT NOTE - Initial Consult  Pharmacy Consult for Heparin (Xarelto on hold) Indication: chest pain/ACS, Afib  No Known Allergies  Patient Measurements: Height: 5\' 10"  (177.8 cm) Weight: 112 kg (246 lb 14.6 oz) IBW/kg (Calculated) : 73  Vital Signs: Temp: 98.5 F (36.9 C) (02/15 0332) Temp Source: Oral (02/15 0332) BP: 150/99 (02/15 0536) Pulse Rate: 82 (02/15 0536)  Labs: Recent Labs    03/17/20 0107 03/17/20 0329  HGB 15.5  --   HCT 44.7  --   PLT 208  --   LABPROT 20.3*  --   INR 1.8*  --   CREATININE 1.05  --   TROPONINIHS 97* 1,224*    Estimated Creatinine Clearance: 94.9 mL/min (by C-G formula based on SCr of 1.05 mg/dL).   Medical History: Past Medical History:  Diagnosis Date  . Coronary artery disease    a. 04/2007 - Ant Stemi: LM: nl, LAD: 100p ( 2.75 x 28 Vision BMS ), LCX: 70-75, RI: 40/50, RCA: 37m, EF 35-40%.;  b. 01/2011 - Abnl ETT (Inf-Lat ST dep);  c.  02/2011 - Ex MV:  Dist ant-sept Infarct.  No ischemia.;  d. 03/30/11 NSTEMI - Cath: 99 LCX into OM1, otw nonobs dzs & NL LV - LCX stented w/ 2 promus DES  . Habitual alcohol use    a. at least a 12 pack/wk (03/30/2011)  . Hypercholesteremia   . Hypertension   . PUD (peptic ulcer disease)    in his 31s     Assessment: 60 y/o M with elevated troponin, consulted to start heparin, Xarelto PTA for afib (last dose 2/14 at 2000). Will begin heparin 24 hours from last Xarelto dose. CBC/renal function good. Anticipate using aPTT to dose for now.   Goal of Therapy:  Heparin level 0.3-0.7 units/ml aPTT 66-102 seconds Monitor platelets by anticoagulation protocol: Yes   Plan:  Start heparin drip at 1400 units/hr at 2000 this evening 0400 aPTT/heparin level Daily CBC/heparin level/aPTT Monitor for bleeding  3/14, PharmD, BCPS Clinical Pharmacist Phone: [phone]

## 2020-03-17 NOTE — ED Notes (Signed)
Up walking to bathroom, gait steady 

## 2020-03-17 NOTE — ED Notes (Signed)
Pt did not respond when called for vitals check 

## 2020-03-18 ENCOUNTER — Encounter (HOSPITAL_COMMUNITY): Payer: Self-pay | Admitting: Cardiovascular Disease

## 2020-03-18 ENCOUNTER — Inpatient Hospital Stay (HOSPITAL_COMMUNITY): Payer: 59

## 2020-03-18 ENCOUNTER — Inpatient Hospital Stay (HOSPITAL_COMMUNITY)
Admission: EM | Disposition: A | Discharge status: Home / Self Care | Payer: Self-pay | Source: Home / Self Care | Attending: Cardiovascular Disease

## 2020-03-18 ENCOUNTER — Other Ambulatory Visit: Payer: Self-pay

## 2020-03-18 ENCOUNTER — Ambulatory Visit (HOSPITAL_COMMUNITY): Admission: RE | Admit: 2020-03-18 | Payer: 59 | Source: Home / Self Care | Admitting: Cardiovascular Disease

## 2020-03-18 DIAGNOSIS — I480 Paroxysmal atrial fibrillation: Secondary | ICD-10-CM

## 2020-03-18 DIAGNOSIS — I2510 Atherosclerotic heart disease of native coronary artery without angina pectoris: Secondary | ICD-10-CM

## 2020-03-18 DIAGNOSIS — R079 Chest pain, unspecified: Secondary | ICD-10-CM | POA: Diagnosis not present

## 2020-03-18 HISTORY — PX: CORONARY STENT INTERVENTION: CATH118234

## 2020-03-18 HISTORY — PX: LEFT HEART CATH AND CORONARY ANGIOGRAPHY: CATH118249

## 2020-03-18 LAB — BASIC METABOLIC PANEL
Anion gap: 10 (ref 5–15)
BUN: 10 mg/dL (ref 6–20)
CO2: 23 mmol/L (ref 22–32)
Calcium: 8.8 mg/dL — ABNORMAL LOW (ref 8.9–10.3)
Chloride: 106 mmol/L (ref 98–111)
Creatinine, Ser: 1.04 mg/dL (ref 0.61–1.24)
GFR, Estimated: >60 mL/min (ref >60)
Glucose, Bld: 85 mg/dL (ref 70–99)
Potassium: 3.8 mmol/L (ref 3.5–5.1)
Sodium: 139 mmol/L (ref 135–145)

## 2020-03-18 LAB — ECHOCARDIOGRAM COMPLETE
Area-P 1/2: 3.65 cm2
Height: 70 in
S' Lateral: 3.3 cm
Weight: 3460.34 oz

## 2020-03-18 LAB — POCT ACTIVATED CLOTTING TIME: Activated Clotting Time: 350 seconds

## 2020-03-18 LAB — APTT: aPTT: 92 seconds — ABNORMAL HIGH (ref 24–36)

## 2020-03-18 LAB — LIPID PANEL
Cholesterol: 126 mg/dL (ref 0–200)
HDL: 44 mg/dL (ref >40)
LDL Cholesterol: 54 mg/dL (ref 0–99)
Total CHOL/HDL Ratio: 2.9 RATIO
Triglycerides: 138 mg/dL (ref <150)
VLDL: 28 mg/dL (ref 0–40)

## 2020-03-18 LAB — HEPARIN LEVEL (UNFRACTIONATED): Heparin Unfractionated: 1.22 IU/mL — ABNORMAL HIGH (ref 0.30–0.70)

## 2020-03-18 SURGERY — LEFT HEART CATH AND CORONARY ANGIOGRAPHY
Anesthesia: LOCAL

## 2020-03-18 MED ORDER — CLOPIDOGREL BISULFATE 75 MG PO TABS
75.0000 mg | ORAL_TABLET | Freq: Every day | ORAL | Status: DC
  Administered 2020-03-19: 75 mg via ORAL
  Filled 2020-03-18: qty 1

## 2020-03-18 MED ORDER — VERAPAMIL HCL 2.5 MG/ML IV SOLN
INTRAVENOUS | Status: DC | PRN
  Administered 2020-03-18: 10 mL via INTRA_ARTERIAL

## 2020-03-18 MED ORDER — SODIUM CHLORIDE 0.9% FLUSH: 3.0000 mL | INTRAVENOUS | Status: DC | PRN

## 2020-03-18 MED ORDER — SODIUM CHLORIDE 0.9 % IV SOLN: 250.0000 mL | INTRAVENOUS | Status: DC | PRN

## 2020-03-18 MED ORDER — HEPARIN (PORCINE) IN NACL 1000-0.9 UT/500ML-% IV SOLN
INTRAVENOUS | Status: AC
  Filled 2020-03-18: qty 1000

## 2020-03-18 MED ORDER — MIDAZOLAM HCL 2 MG/2ML IJ SOLN
INTRAMUSCULAR | Status: DC | PRN
  Administered 2020-03-18: 2 mg via INTRAVENOUS

## 2020-03-18 MED ORDER — FENTANYL CITRATE (PF) 100 MCG/2ML IJ SOLN
INTRAMUSCULAR | Status: AC
  Filled 2020-03-18: qty 2

## 2020-03-18 MED ORDER — SODIUM CHLORIDE 0.9% FLUSH: 3.0000 mL | Freq: Two times a day (BID) | INTRAVENOUS | Status: DC

## 2020-03-18 MED ORDER — LIDOCAINE HCL (PF) 1 % IJ SOLN
INTRAMUSCULAR | Status: DC | PRN
  Administered 2020-03-18: 2 mL

## 2020-03-18 MED ORDER — HEPARIN SODIUM (PORCINE) 1000 UNIT/ML IJ SOLN
INTRAMUSCULAR | Status: AC
  Filled 2020-03-18: qty 1

## 2020-03-18 MED ORDER — HEPARIN (PORCINE) IN NACL 1000-0.9 UT/500ML-% IV SOLN
INTRAVENOUS | Status: DC | PRN
  Administered 2020-03-18 (×2): 500 mL

## 2020-03-18 MED ORDER — HEPARIN SODIUM (PORCINE) 1000 UNIT/ML IJ SOLN
INTRAMUSCULAR | Status: DC | PRN
  Administered 2020-03-18: 5000 [IU] via INTRAVENOUS
  Administered 2020-03-18: 6000 [IU] via INTRAVENOUS

## 2020-03-18 MED ORDER — LIDOCAINE HCL (PF) 1 % IJ SOLN
INTRAMUSCULAR | Status: AC
  Filled 2020-03-18: qty 30

## 2020-03-18 MED ORDER — SODIUM CHLORIDE 0.9 % WEIGHT BASED INFUSION
1.0000 mL/kg/h | INTRAVENOUS | Status: DC
  Administered 2020-03-18: 1 mL/kg/h via INTRAVENOUS

## 2020-03-18 MED ORDER — NITROGLYCERIN 1 MG/10 ML FOR IR/CATH LAB
INTRA_ARTERIAL | Status: AC
  Filled 2020-03-18: qty 10

## 2020-03-18 MED ORDER — HYDRALAZINE HCL 20 MG/ML IJ SOLN: 10.0000 mg | INTRAMUSCULAR | Status: AC | PRN

## 2020-03-18 MED ORDER — SODIUM CHLORIDE 0.9 % IV SOLN: INTRAVENOUS | Status: AC

## 2020-03-18 MED ORDER — LABETALOL HCL 5 MG/ML IV SOLN: 10.0000 mg | INTRAVENOUS | Status: AC | PRN

## 2020-03-18 MED ORDER — FENTANYL CITRATE (PF) 100 MCG/2ML IJ SOLN
INTRAMUSCULAR | Status: DC | PRN
  Administered 2020-03-18: 25 ug via INTRAVENOUS

## 2020-03-18 MED ORDER — SODIUM CHLORIDE 0.9% FLUSH
3.0000 mL | Freq: Two times a day (BID) | INTRAVENOUS | Status: DC
  Administered 2020-03-18: 3 mL via INTRAVENOUS

## 2020-03-18 MED ORDER — MIDAZOLAM HCL 2 MG/2ML IJ SOLN
INTRAMUSCULAR | Status: AC
  Filled 2020-03-18: qty 2

## 2020-03-18 MED ORDER — NITROGLYCERIN 1 MG/10 ML FOR IR/CATH LAB
INTRA_ARTERIAL | Status: DC | PRN
  Administered 2020-03-18: 200 ug via INTRACORONARY

## 2020-03-18 MED ORDER — SODIUM CHLORIDE 0.9 % WEIGHT BASED INFUSION
3.0000 mL/kg/h | INTRAVENOUS | Status: DC
  Administered 2020-03-18: 3 mL/kg/h via INTRAVENOUS

## 2020-03-18 MED ORDER — VERAPAMIL HCL 2.5 MG/ML IV SOLN
INTRAVENOUS | Status: AC
  Filled 2020-03-18: qty 2

## 2020-03-18 MED ORDER — CLOPIDOGREL BISULFATE 300 MG PO TABS
ORAL_TABLET | ORAL | Status: DC | PRN
  Administered 2020-03-18: 300 mg via ORAL

## 2020-03-18 MED ORDER — CLOPIDOGREL BISULFATE 300 MG PO TABS
ORAL_TABLET | ORAL | Status: AC
  Filled 2020-03-18: qty 1

## 2020-03-18 MED ORDER — IOHEXOL 350 MG/ML SOLN
INTRAVENOUS | Status: DC | PRN
  Administered 2020-03-18: 160 mL

## 2020-03-18 SURGICAL SUPPLY — 19 items
BALLN SAPPHIRE 2.5X12 (BALLOONS) ×2
BALLN SAPPHIRE ~~LOC~~ 2.75X15 (BALLOONS) ×1 IMPLANT
BALLN WOLVERINE 2.50X10 (BALLOONS) ×2
BALLOON SAPPHIRE 2.5X12 (BALLOONS) IMPLANT
BALLOON WOLVERINE 2.50X10 (BALLOONS) IMPLANT
CATH 5FR JL3.5 JR4 ANG PIG MP (CATHETERS) ×1 IMPLANT
CATH INFINITI 5 FR 3DRC (CATHETERS) ×1 IMPLANT
CATH VISTA GUIDE 6FR XBLAD3.5 (CATHETERS) ×1 IMPLANT
DEVICE RAD COMP TR BAND LRG (VASCULAR PRODUCTS) ×1 IMPLANT
GLIDESHEATH SLEND SS 6F .021 (SHEATH) ×1 IMPLANT
GUIDEWIRE INQWIRE 1.5J.035X260 (WIRE) IMPLANT
INQWIRE 1.5J .035X260CM (WIRE) ×2
KIT ENCORE 26 ADVANTAGE (KITS) ×1 IMPLANT
KIT HEART LEFT (KITS) ×2 IMPLANT
PACK CARDIAC CATHETERIZATION (CUSTOM PROCEDURE TRAY) ×2 IMPLANT
STENT RESOLUTE ONYX 2.75X38 (Permanent Stent) ×1 IMPLANT
TRANSDUCER W/STOPCOCK (MISCELLANEOUS) ×2 IMPLANT
TUBING CIL FLEX 10 FLL-RA (TUBING) ×2 IMPLANT
WIRE COUGAR XT STRL 190CM (WIRE) ×1 IMPLANT

## 2020-03-18 NOTE — Progress Notes (Signed)
ANTICOAGULATION CONSULT NOTE Pharmacy Consult for Heparin  Indication: chest pain/ACS, Afib  No Known Allergies  Patient Measurements: Height: 5\' 10"  (177.8 cm) Weight: 98.7 kg (217 lb 9.5 oz) IBW/kg (Calculated) : 73  Vital Signs: Temp: 97.8 F (36.6 C) (02/15 2352) Temp Source: Oral (02/15 2352) BP: 113/88 (02/15 2352) Pulse Rate: 85 (02/15 2352)  Labs: Recent Labs    03/17/20 0107 03/17/20 0329 03/18/20 0416  HGB 15.5  --   --   HCT 44.7  --   --   PLT 208  --   --   APTT  --   --  92*  LABPROT 20.3*  --   --   INR 1.8*  --   --   HEPARINUNFRC  --   --  1.22*  CREATININE 1.05  --   --   TROPONINIHS 97* 1,224*  --     Estimated Creatinine Clearance: 89.3 mL/min (by C-G formula based on SCr of 1.05 mg/dL).  Assessment: 60 y.o. male admitted with elevated troponin, h/o Afib and Eliquis on hold, for heparin  Goal of Therapy:  Heparin level 0.3-0.7 units/ml aPTT 66-102 seconds Monitor platelets by anticoagulation protocol: Yes   Plan:  Continue Heparin at current rate  Follow up after cath today  46, PharmD, BCPS

## 2020-03-18 NOTE — Progress Notes (Signed)
  Echocardiogram 2D Echocardiogram has been performed.  Augustine Radar 03/18/2020, 8:55 AM

## 2020-03-18 NOTE — Progress Notes (Signed)
 Progress Note  Patient Name: [name] Date of Encounter: 03/18/2020  [facility] Cardiologist: Christopher McAlhany, MD   Subjective   [name] was admitted yesterday with a non-STEMI.  He was in A. fib with RVR and currently is in sinus rhythm on IV amiodarone and heparin.  He said no further chest pain.  He was on Eliquis prior to admission because of PAF which has been held.  Plan for coronary angiography today.  Inpatient Medications    Scheduled Meds: . aspirin  324 mg Oral NOW   Or  . aspirin  300 mg Rectal NOW  . aspirin EC  81 mg Oral Daily  . atorvastatin  80 mg Oral Daily  . carvedilol  6.25 mg Oral BID WC  . clopidogrel  75 mg Oral Daily  . ezetimibe  10 mg Oral Daily  . sodium chloride flush  3 mL Intravenous Q12H   Continuous Infusions: . sodium chloride    . sodium chloride 1 mL/kg/hr (03/18/20 0843)  . amiodarone 30 mg/hr (03/18/20 0353)  . heparin 1,400 Units/hr (03/17/20 2049)   PRN Meds: sodium chloride, acetaminophen, nitroGLYCERIN, ondansetron (ZOFRAN) IV, sodium chloride flush   Vital Signs    Vitals:   03/17/20 2300 03/17/20 2352 03/18/20 0728 03/18/20 0736  BP: 113/88 113/88 127/83   Pulse: 73 85 81   Resp: 19 18 13   Temp:  97.8 F (36.6 C) 98.4 F (36.9 C)   TempSrc:  Oral Oral   SpO2: 94% 97% 96%   Weight:    98.1 kg  Height:        Intake/Output Summary (Last 24 hours) at 03/18/2020 1006 Last data filed at 03/17/2020 1955 Gross per 24 hour  Intake 504.69 ml  Output --  Net 504.69 ml   Last 3 Weights 03/18/2020 03/17/2020 03/17/2020  Weight (lbs) 216 lb 4.3 oz 217 lb 9.5 oz 246 lb 14.6 oz  Weight (kg) 98.1 kg 98.7 kg 112 kg      Telemetry    Sinus rhythm- Personally Reviewed  ECG    Not performed today- Personally Reviewed  Physical Exam   GEN: No acute distress.   Neck: No JVD Cardiac: RRR, no murmurs, rubs, or gallops.  Respiratory: Clear to auscultation bilaterally. GI: Soft, nontender, non-distended  MS:  No edema; No deformity. Neuro:  Nonfocal  Psych: Normal affect   Labs    High Sensitivity Troponin:   Recent Labs  Lab 03/17/20 0107 03/17/20 0329  TROPONINIHS 97* 1,224*      Chemistry Recent Labs  Lab 03/17/20 0107 03/18/20 0416  NA 140 139  K 4.5 3.8  CL 101 106  CO2 28 23  GLUCOSE 94 85  BUN 10 10  CREATININE 1.05 1.04  CALCIUM 9.9 8.8*  GFRNONAA >60 >60  ANIONGAP 11 10     Hematology Recent Labs  Lab 03/17/20 0107  WBC 7.5  RBC 4.82  HGB 15.5  HCT 44.7  MCV 92.7  MCH 32.2  MCHC 34.7  RDW 13.9  PLT 208    BNPNo results for input(s): BNP, PROBNP in the last 168 hours.   DDimer No results for input(s): DDIMER in the last 168 hours.   Radiology    DG Chest 2 View  Result Date: 03/17/2020 CLINICAL DATA:  Left-sided chest pain.  Shortness of breath. EXAM: CHEST - 2 VIEW COMPARISON:  Remote radiograph 04/19/2007 FINDINGS: The cardiomediastinal contours are normal. Coronary stents visualized. The lungs are clear. Pulmonary vasculature is   normal. No consolidation, pleural effusion, or pneumothorax. No acute osseous abnormalities are seen. IMPRESSION: No acute chest findings. Electronically Signed   By: Narda Rutherford M.D.   On: 03/17/2020 01:15    Cardiac Studies   None  Patient Profile     [name] is a 60 y.o. male with STEMI 2009 status post bare-metal stent to LAD, cath 2013 with overlapping DES to OM1, cath 2020 with DES to RI, hypertension, hyperlipidemia, EtOH and PAF use who presented with chest pain.   Assessment & Plan    1: CAD/non-STEMI-history of CAD status post multiple interventions in the past most recently in 2020 with DES to ramus branch.  He was admitted with chest pain in the setting of A. fib with RVR which has since resolved as has his chest pain.  Troponins rose to thousand.  He is scheduled for diagnostic cath today.  2: PAF-initial EKG showed A. fib with RVR, currently he is in sinus rhythm.  He was on carvedilol.  His  oral anticoagulant has been held and he has been on IV heparin.  3: Nonsustained ventricular tachycardia-he had a 30 beat run of nonsustained VT.  He is currently on amiodarone.  This may have been ischemically mediated given his elevated troponins.  It is not clear whether he will need to continue this.  4: Essential hypertension-continue carvedilol twice daily  5: Hyperlipidemia-on high-dose atorvastatin and Zetia with an LDL 54  For questions or updates, please contact [facility] Please consult [email or web address] for contact info under        Signed, Nanetta Batty, MD  03/18/2020, 10:06 AM

## 2020-03-18 NOTE — Interval H&P Note (Signed)
History and Physical Interval Note:  03/18/2020 11:00 AM  [name]  has presented today for surgery, with the diagnosis of nstemi.  The various methods of treatment have been discussed with the patient and family. After consideration of risks, benefits and other options for treatment, the patient has consented to  Procedure(s): LEFT HEART CATH AND CORONARY ANGIOGRAPHY (N/A) as a surgical intervention.  The patient's history has been reviewed, patient examined, no change in status, stable for surgery.  I have reviewed the patient's chart and labs.  Questions were answered to the patient's satisfaction.    Cath Lab Visit (complete for each Cath Lab visit)  Clinical Evaluation Leading to the Procedure:   ACS: Yes.    Non-ACS:    Anginal Classification: CCS III  Anti-ischemic medical therapy: Minimal Therapy (1 class of medications)  Non-Invasive Test Results: No non-invasive testing performed  Prior CABG: No previous CABG        Verne Carrow

## 2020-03-18 NOTE — H&P (View-Only) (Signed)
Progress Note  Patient Name: [name] Date of Encounter: 03/18/2020  [facility] Cardiologist: Verne Carrow, MD   Subjective   [name] was admitted yesterday with a non-STEMI.  He was in A. fib with RVR and currently is in sinus rhythm on IV amiodarone and heparin.  He said no further chest pain.  He was on Eliquis prior to admission because of PAF which has been held.  Plan for coronary angiography today.  Inpatient Medications    Scheduled Meds: . aspirin  324 mg Oral NOW   Or  . aspirin  300 mg Rectal NOW  . aspirin EC  81 mg Oral Daily  . atorvastatin  80 mg Oral Daily  . carvedilol  6.25 mg Oral BID WC  . clopidogrel  75 mg Oral Daily  . ezetimibe  10 mg Oral Daily  . sodium chloride flush  3 mL Intravenous Q12H   Continuous Infusions: . sodium chloride    . sodium chloride 1 mL/kg/hr (03/18/20 0843)  . amiodarone 30 mg/hr (03/18/20 0353)  . heparin 1,400 Units/hr (03/17/20 2049)   PRN Meds: sodium chloride, acetaminophen, nitroGLYCERIN, ondansetron (ZOFRAN) IV, sodium chloride flush   Vital Signs    Vitals:   03/17/20 2300 03/17/20 2352 03/18/20 0728 03/18/20 0736  BP: 113/88 113/88 127/83   Pulse: 73 85 81   Resp: 19 18 13    Temp:  97.8 F (36.6 C) 98.4 F (36.9 C)   TempSrc:  Oral Oral   SpO2: 94% 97% 96%   Weight:    98.1 kg  Height:        Intake/Output Summary (Last 24 hours) at 03/18/2020 1006 Last data filed at 03/17/2020 1955 Gross per 24 hour  Intake 504.69 ml  Output --  Net 504.69 ml   Last 3 Weights 03/18/2020 03/17/2020 03/17/2020  Weight (lbs) 216 lb 4.3 oz 217 lb 9.5 oz 246 lb 14.6 oz  Weight (kg) 98.1 kg 98.7 kg 112 kg      Telemetry    Sinus rhythm- Personally Reviewed  ECG    Not performed today- Personally Reviewed  Physical Exam   GEN: No acute distress.   Neck: No JVD Cardiac: RRR, no murmurs, rubs, or gallops.  Respiratory: Clear to auscultation bilaterally. GI: Soft, nontender, non-distended  MS:  No edema; No deformity. Neuro:  Nonfocal  Psych: Normal affect   Labs    High Sensitivity Troponin:   Recent Labs  Lab 03/17/20 0107 03/17/20 0329  TROPONINIHS 97* 1,224*      Chemistry Recent Labs  Lab 03/17/20 0107 03/18/20 0416  NA 140 139  K 4.5 3.8  CL 101 106  CO2 28 23  GLUCOSE 94 85  BUN 10 10  CREATININE 1.05 1.04  CALCIUM 9.9 8.8*  GFRNONAA >60 >60  ANIONGAP 11 10     Hematology Recent Labs  Lab 03/17/20 0107  WBC 7.5  RBC 4.82  HGB 15.5  HCT 44.7  MCV 92.7  MCH 32.2  MCHC 34.7  RDW 13.9  PLT 208    BNPNo results for input(s): BNP, PROBNP in the last 168 hours.   DDimer No results for input(s): DDIMER in the last 168 hours.   Radiology    DG Chest 2 View  Result Date: 03/17/2020 CLINICAL DATA:  Left-sided chest pain.  Shortness of breath. EXAM: CHEST - 2 VIEW COMPARISON:  Remote radiograph 04/19/2007 FINDINGS: The cardiomediastinal contours are normal. Coronary stents visualized. The lungs are clear. Pulmonary vasculature is  normal. No consolidation, pleural effusion, or pneumothorax. No acute osseous abnormalities are seen. IMPRESSION: No acute chest findings. Electronically Signed   By: Narda Rutherford M.D.   On: 03/17/2020 01:15    Cardiac Studies   None  Patient Profile     [name] is a 60 y.o. male with STEMI 2009 status post bare-metal stent to LAD, cath 2013 with overlapping DES to OM1, cath 2020 with DES to RI, hypertension, hyperlipidemia, EtOH and PAF use who presented with chest pain.   Assessment & Plan    1: CAD/non-STEMI-history of CAD status post multiple interventions in the past most recently in 2020 with DES to ramus branch.  He was admitted with chest pain in the setting of A. fib with RVR which has since resolved as has his chest pain.  Troponins rose to thousand.  He is scheduled for diagnostic cath today.  2: PAF-initial EKG showed A. fib with RVR, currently he is in sinus rhythm.  He was on carvedilol.  His  oral anticoagulant has been held and he has been on IV heparin.  3: Nonsustained ventricular tachycardia-he had a 30 beat run of nonsustained VT.  He is currently on amiodarone.  This may have been ischemically mediated given his elevated troponins.  It is not clear whether he will need to continue this.  4: Essential hypertension-continue carvedilol twice daily  5: Hyperlipidemia-on high-dose atorvastatin and Zetia with an LDL 54  For questions or updates, please contact [facility] Please consult [email or web address] for contact info under        Signed, Nanetta Batty, MD  03/18/2020, 10:06 AM

## 2020-03-19 LAB — CBC
HCT: 40.8 % (ref 39.0–52.0)
Hemoglobin: 13.7 g/dL (ref 13.0–17.0)
MCH: 31.8 pg (ref 26.0–34.0)
MCHC: 33.6 g/dL (ref 30.0–36.0)
MCV: 94.7 fL (ref 80.0–100.0)
Platelets: 146 10*3/uL — ABNORMAL LOW (ref 150–400)
RBC: 4.31 MIL/uL (ref 4.22–5.81)
RDW: 14.2 % (ref 11.5–15.5)
WBC: 6 10*3/uL (ref 4.0–10.5)
nRBC: 0.5 % — ABNORMAL HIGH (ref 0.0–0.2)

## 2020-03-19 LAB — BASIC METABOLIC PANEL
Anion gap: 10 (ref 5–15)
BUN: 10 mg/dL (ref 6–20)
CO2: 19 mmol/L — ABNORMAL LOW (ref 22–32)
Calcium: 8.4 mg/dL — ABNORMAL LOW (ref 8.9–10.3)
Chloride: 108 mmol/L (ref 98–111)
Creatinine, Ser: 0.91 mg/dL (ref 0.61–1.24)
GFR, Estimated: >60 mL/min (ref >60)
Glucose, Bld: 98 mg/dL (ref 70–99)
Potassium: 4.2 mmol/L (ref 3.5–5.1)
Sodium: 137 mmol/L (ref 135–145)

## 2020-03-19 LAB — APTT: aPTT: 30 seconds (ref 24–36)

## 2020-03-19 MED ORDER — AMIODARONE HCL 200 MG PO TABS: 200.0000 mg | ORAL_TABLET | Freq: Two times a day (BID) | ORAL | 3 refills | Status: DC

## 2020-03-19 MED ORDER — AMIODARONE HCL 200 MG PO TABS
200.0000 mg | ORAL_TABLET | Freq: Two times a day (BID) | ORAL | Status: DC
  Administered 2020-03-19: 200 mg via ORAL
  Filled 2020-03-19: qty 1

## 2020-03-19 MED ORDER — NITROGLYCERIN 0.4 MG SL SUBL: SUBLINGUAL_TABLET | SUBLINGUAL | 3 refills | Status: DC

## 2020-03-19 MED ORDER — RIVAROXABAN 20 MG PO TABS: 20.0000 mg | ORAL_TABLET | Freq: Every day | ORAL | Status: DC

## 2020-03-19 MED FILL — Verapamil HCl IV Soln 2.5 MG/ML: INTRAVENOUS | Qty: 2 | Status: AC

## 2020-03-19 MED FILL — Lidocaine HCl Local Preservative Free (PF) Inj 1%: INTRAMUSCULAR | Qty: 30 | Status: AC

## 2020-03-19 MED FILL — Heparin Sod (Porcine)-NaCl IV Soln 1000 Unit/500ML-0.9%: INTRAVENOUS | Qty: 1000 | Status: AC

## 2020-03-19 NOTE — Discharge Summary (Addendum)
Discharge Summary    Patient ID: [name] MRN: [id number]; DOB: 1960-10-14  Admit date: 03/17/2020 Discharge date: 03/19/2020  PCP:  Verlon Au, MD   [facility]  Cardiologist:  Verne Carrow, MD  Advanced Practice Provider:  No care team member to display Electrophysiologist:  None        Discharge Diagnoses    Principal Problem:   Non-STEMI (non-ST elevated myocardial infarction) [facility]) Active Problems:   HYPERCHOLESTEROLEMIA   Essential hypertension   Coronary artery disease    Diagnostic Studies/Procedures    Echocardiogram 03/18/20: 1. Left ventricular ejection fraction, by estimation, is 60 to 65%. The  left ventricle has normal function. The left ventricle has no regional  wall motion abnormalities. Left ventricular diastolic parameters were  normal.   2. Right ventricular systolic function is normal. The right ventricular  size is normal. Tricuspid regurgitation signal is inadequate for assessing  PA pressure.   3. The mitral valve is normal in structure. Trivial mitral valve  regurgitation.   4. The aortic valve is tricuspid. Aortic valve regurgitation is not  visualized. No aortic stenosis is present.   5. The inferior vena cava is normal in size with greater than 50%  respiratory variability, suggesting right atrial pressure of 3 mmHg.  Left heart catheterization 03/18/20: 1st Diag lesion is 30% stenosed. Prox LAD to Mid LAD lesion is 30% stenosed. Ost Cx to Prox Cx lesion is 60% stenosed. Mid RCA lesion is 60% stenosed. Previously placed Ramus drug eluting stent is widely patent. Prox Cx to Mid Cx lesion is 99% stenosed. A drug-eluting stent was successfully placed using a STENT RESOLUTE ONYX [id number]. Post intervention, there is a 0% residual stenosis. Post intervention, there is a 0% residual stenosis.   1. Patent mid LAD stent with minimal restenosis 2. Patent intermediate stent 3. Moderately severe  proximal Circumflex stenosis. Patent mid Circumflex stent with severe restenosis within the old stent.  4. Successful PTCA/DES x 1 proximal and mid Circumflex 5. Moderate mid RCA stenosis angiographically unchanged since cath in 2020.    Recommendations: Continue DAPT with ASA and Plavix for at least one month. Consider stopping ASA in one month given the fact that he will be on Xarelto as well. Resume Xarelto   Diagnostic Dominance: Right    Intervention      _____________   History of Present Illness     [name] is a 60 y.o. male with a PMH of CAD s/p STEMI in 2009 with BMS to LAD with subsequent PCI/DES to OM1 in 2013 and PCI/DES to RI in 2020, HTN, HLD, PAF, and Etoh abuse, who presented with chest pain.   [name] is followed by Dr. Clifton James as an outpatient. He has a history of anterior STEMI treated with bare-metal stent to proximal LAD 04/2007. He was admitted in 2013 with unstable angina underwent cath with severe stenosis in OM1 treated with overlapping DES. There is residual stenosis of a diagonal vessel 50% and 20% RCA. He presented with stable angina in November 2020 and underwent stress testing which was noted to have high risk findings. Underwent cardiac cath showing 70 to 80% stenosis in the ramus intermediate successfully treated with PCI/DES. Did have residual 40% stenosis in the proximal circumflex and 60% stenosis of the RCA found to be not hemodynamically significant by DFR. Recommendations were to continue with medical therapy, continue DAPT with aspirin and Plavix indefinitely with aggressive secondary prevention.   He was last  seen in the office on 07/2019 after recently wearing a ZIO monitor. Review of results showed episodes of SVT and atrial fibrillation. He was started on Xarelto 20 mg daily. Continued on other home medications without significant change.   He presented to the ER on 2/14 with episodes of chest pain. States he was able to work that day came  home and ate dinner. Shortly afterwards he began to have a chest ache sensation in the left side of his chest with radiation into his arms bilaterally. States is very similar to what he had with prior episodes which required stenting. Also felt like his heart was "out of rhythm" but not necessarily palpitations. Did have some shortness of breath. Symptoms persisted for several hours and he eventually presented to the ED for further evaluation.   He is labs showed stable electrolytes, creatinine 1.05, high-sensitivity troponin 97>> 1224, WBC 7.5, hemoglobin 15.5. Covid negative. Chest x-ray negative. Initial EKG showed atrial fibrillation with RVR, 137 bpm with ST depression in lateral leads. Follow-up EKG showed conversion to sinus rhythm with no acute ST/T wave abnormalities. While in the ED he had an episode of 30 beats of nonsustained VT, reported asymptomatic. He was started on IV amiodarone. Radiology has been called for admission. Of note patient reports his last dose of Eliquis was 8 PM the evening of 2/14.  Hospital Course     Consultants: None   1. NSTEMI in patient with CAD s/p multiple PCI: patient presented with chest pain. Found to have HsTrop elevation from 97>1224. EKG is non-ischemic though revealed atrial fibrillation with RVR. Echo showed  EF 60-65%, no RWMA, normal LV diastolic function, and trivial AI/MR. He underwent a LHC 03/18/20 showed severe ISR of previously placed mLCx stent managed with PCI/DES, patent mLAD stent with minimal ISR, and medically managed 60% mRCA stenosis and 30% pLAD and D1 stenosis. He was recommended to continue DAPT with aspirin and plavix x1 month, then continue plavix given ongoing anticoagulation with xarelto. Symptoms improved prior to discharge.  - Continue aspirin and plavix - anticipate stopping aspirin in 1 month given need for anticoagulation - Continue statin - Continue BBlocker - Rx for SL nitro renewed.   2. Paroxysmal atrial fibrillation:  initial EKG with atrial fibrillation with RVR. He converted to sinus rhythm and only had one other brief episode this admission after starting IV amiodarone. He was transitioned to po amiodarone prior to discharge with recommendations to consider Afib ablation in the future.  - Plan to restart xarelto on the evening of 03/19/20 - Continue amiodarone for rhythm control - anticipate tapering to  daily at his follow-up visit - Continue carvedilol for rate control  3. HTN: BP stable this admission - Continue carvedilol   4. HLD: LDL 24 this admission - Continue atorvastatin and zetia  5. NSVT: patient had a 30 beat run this admission. Thought to possibly be ischemic mediated. Started on amiodarone as above - Continue carvedilol for rate control     Did the patient have an acute coronary syndrome (MI, NSTEMI, STEMI, etc) this admission?:  Yes                               AHA/ACC Clinical Performance & Quality Measures: Aspirin prescribed? - Yes ADP Receptor Inhibitor (Plavix/Clopidogrel, Brilinta/Ticagrelor or Effient/Prasugrel) prescribed (includes medically managed patients)? - Yes Beta Blocker prescribed? - Yes High Intensity Statin (Lipitor 40-80mg  or Crestor 20-40mg ) prescribed? - Yes EF  assessed during THIS hospitalization? - Yes For EF <40%, was ACEI/ARB prescribed? - Not Applicable (EF >/= 40%) For EF <40%, Aldosterone Antagonist (Spironolactone or Eplerenone) prescribed? - Not Applicable (EF >/= 40%) Cardiac Rehab Phase II ordered (including medically managed patients)? - Yes       _____________  Discharge Vitals Blood pressure 125/87, pulse 84, temperature 99.3 F (37.4 C), temperature source Oral, resp. rate 14, height 5\' 10"  (1.778 m), weight 98.1 kg, SpO2 97 %.  Filed Weights   03/17/20 0053 03/17/20 1747 03/18/20 0736  Weight: 112 kg 98.7 kg 98.1 kg    Labs & Radiologic Studies    CBC Recent Labs    03/17/20 0107 03/19/20 0039  WBC 7.5 6.0  HGB 15.5 13.7   HCT 44.7 40.8  MCV 92.7 94.7  PLT 208 146*   Basic Metabolic Panel Recent Labs    03/21/20 0807 03/18/20 0416 03/19/20 0039  NA  --  139 137  K  --  3.8 4.2  CL  --  106 108  CO2  --  23 19*  GLUCOSE  --  85 98  BUN  --  10 10  CREATININE  --  1.04 0.91  CALCIUM  --  8.8* 8.4*  MG 2.1  --   --    Liver Function Tests No results for input(s): AST, ALT, ALKPHOS, BILITOT, PROT, ALBUMIN in the last 72 hours. No results for input(s): LIPASE, AMYLASE in the last 72 hours. High Sensitivity Troponin:   Recent Labs  Lab 03/17/20 0107 03/17/20 0329  TROPONINIHS 97* 1,224*    BNP Invalid input(s): POCBNP D-Dimer No results for input(s): DDIMER in the last 72 hours. Hemoglobin A1C No results for input(s): HGBA1C in the last 72 hours. Fasting Lipid Panel Recent Labs    03/18/20 0416  CHOL 126  HDL 44  LDLCALC 54  TRIG 138  CHOLHDL 2.9   Thyroid Function Tests No results for input(s): TSH, T4TOTAL, T3FREE, THYROIDAB in the last 72 hours.  Invalid input(s): FREET3 _____________  DG Chest 2 View  Result Date: 03/17/2020 CLINICAL DATA:  Left-sided chest pain.  Shortness of breath. EXAM: CHEST - 2 VIEW COMPARISON:  Remote radiograph 04/19/2007 FINDINGS: The cardiomediastinal contours are normal. Coronary stents visualized. The lungs are clear. Pulmonary vasculature is normal. No consolidation, pleural effusion, or pneumothorax. No acute osseous abnormalities are seen. IMPRESSION: No acute chest findings. Electronically Signed   By: 04/21/2007 M.D.   On: 03/17/2020 01:15   CARDIAC CATHETERIZATION  Result Date: 03/18/2020  1st Diag lesion is 30% stenosed.  Prox LAD to Mid LAD lesion is 30% stenosed.  Ost Cx to Prox Cx lesion is 60% stenosed.  Mid RCA lesion is 60% stenosed.  Previously placed Ramus drug eluting stent is widely patent.  Prox Cx to Mid Cx lesion is 99% stenosed.  A drug-eluting stent was successfully placed using a STENT RESOLUTE ONYX 03/20/2020.  Post  intervention, there is a 0% residual stenosis.  Post intervention, there is a 0% residual stenosis.  1. Patent mid LAD stent with minimal restenosis 2. Patent intermediate stent 3. Moderately severe proximal Circumflex stenosis. Patent mid Circumflex stent with severe restenosis within the old stent. 4. Successful PTCA/DES x 1 proximal and mid Circumflex 5. Moderate mid RCA stenosis angiographically unchanged since cath in 2020. Recommendations: Continue DAPT with ASA and Plavix for at least one month. Consider stopping ASA in one month given the fact that he will be on Xarelto as well. Resume Xarelto  tomorrow.   ECHOCARDIOGRAM COMPLETE  Result Date: 03/18/2020    ECHOCARDIOGRAM REPORT   Patient Name:   [name] Date of Exam: 03/18/2020 Medical Rec #:  [id number]    Height:       70.0 in Accession #:    [id number]   Weight:       217.6 lb Date of Birth:  [birth year]    BSA:          2.163 m Patient Age:    59 years     BP:           127/83 mmHg Patient Gender: M            HR:           81 bpm. Exam Location:  Inpatient Procedure: 2D Echo, Cardiac Doppler and Color Doppler Indications:    R07.9* Chest pain, unspecified  History:        Patient has prior history of Echocardiogram examinations, most                 recent 01/03/2019. CAD; Risk Factors:Hypertension and ETOH.  Sonographer:    Eulah Pont RDCS Referring Phys: [phone] LINDSAY B ROBERTS IMPRESSIONS  1. Left ventricular ejection fraction, by estimation, is 60 to 65%. The left ventricle has normal function. The left ventricle has no regional wall motion abnormalities. Left ventricular diastolic parameters were normal.  2. Right ventricular systolic function is normal. The right ventricular size is normal. Tricuspid regurgitation signal is inadequate for assessing PA pressure.  3. The mitral valve is normal in structure. Trivial mitral valve regurgitation.  4. The aortic valve is tricuspid. Aortic valve regurgitation is not visualized. No aortic  stenosis is present.  5. The inferior vena cava is normal in size with greater than 50% respiratory variability, suggesting right atrial pressure of 3 mmHg. FINDINGS  Left Ventricle: Left ventricular ejection fraction, by estimation, is 60 to 65%. The left ventricle has normal function. The left ventricle has no regional wall motion abnormalities. The left ventricular internal cavity size was normal in size. There is  no left ventricular hypertrophy. Left ventricular diastolic parameters were normal. Right Ventricle: The right ventricular size is normal. No increase in right ventricular wall thickness. Right ventricular systolic function is normal. Tricuspid regurgitation signal is inadequate for assessing PA pressure. Left Atrium: Left atrial size was normal in size. Right Atrium: Right atrial size was normal in size. Pericardium: There is no evidence of pericardial effusion. Mitral Valve: The mitral valve is normal in structure. Trivial mitral valve regurgitation. Tricuspid Valve: The tricuspid valve is normal in structure. Tricuspid valve regurgitation is trivial. Aortic Valve: The aortic valve is tricuspid. Aortic valve regurgitation is not visualized. No aortic stenosis is present. Pulmonic Valve: The pulmonic valve was not well visualized. Pulmonic valve regurgitation is not visualized. Aorta: The aortic root and ascending aorta are structurally normal, with no evidence of dilitation. Venous: The inferior vena cava is normal in size with greater than 50% respiratory variability, suggesting right atrial pressure of 3 mmHg. IAS/Shunts: The interatrial septum was not well visualized.  LEFT VENTRICLE PLAX 2D LVIDd:         4.50 cm  Diastology LVIDs:         3.30 cm  LV e' medial:    7.71 cm/s LV PW:         1.00 cm  LV E/e' medial:  12.2 LV IVS:        0.80 cm  LV  e' lateral:   9.81 cm/s LVOT diam:     1.90 cm  LV E/e' lateral: 9.6 LV SV:         49 LV SV Index:   23 LVOT Area:     2.84 cm  RIGHT VENTRICLE RV S  prime:     10.40 cm/s TAPSE (M-mode): 2.1 cm LEFT ATRIUM             Index       RIGHT ATRIUM           Index LA diam:        3.40 cm 1.57 cm/m  RA Area:     11.10 cm LA Vol (A2C):   45.0 ml 20.80 ml/m RA Volume:   21.10 ml  9.75 ml/m LA Vol (A4C):   49.6 ml 22.93 ml/m LA Biplane Vol: 50.2 ml 23.20 ml/m  AORTIC VALVE LVOT Vmax:   89.40 cm/s LVOT Vmean:  66.000 cm/s LVOT VTI:    0.174 m  AORTA Ao Root diam: 3.20 cm Ao Asc diam:  2.90 cm MITRAL VALVE MV Area (PHT): 3.65 cm    SHUNTS MV Decel Time: 208 msec    Systemic VTI:  0.17 m MV E velocity: 93.80 cm/s  Systemic Diam: 1.90 cm MV A velocity: 37.30 cm/s MV E/A ratio:  2.51 Epifanio Lesches MD Electronically signed by Epifanio Lesches MD Signature Date/Time: 03/18/2020/11:04:57 AM    Final    Disposition   Pt is being discharged home today in good condition.  Follow-up Plans & Appointments     Follow-up Information     Ronney Asters, NP Follow up on 04/02/2020.   Specialty: Cardiology Why: Please arrive 15 minutes early for your 11:15am post-hospital cardiology appointment Contact information: [address] [place] [address] [phone]                Discharge Instructions     Amb Referral to Cardiac Rehabilitation   Complete by: As directed    Diagnosis:  NSTEMI Coronary Stents     After initial evaluation and assessments completed: Virtual Based Care may be provided alone or in conjunction with Phase 2 Cardiac Rehab based on patient barriers.: Yes       Discharge Medications   Allergies as of 03/19/2020   No Known Allergies      Medication List     TAKE these medications    amiodarone 200 MG tablet Commonly known as: PACERONE Take 1 tablet (200 mg total) by mouth 2 (two) times daily.   aspirin 81 MG tablet Take 81 mg by mouth daily.   atorvastatin 80 MG tablet Commonly known as: LIPITOR TAKE 1 TABLET BY MOUTH EVERY DAY What changed: when to take this   b complex vitamins  tablet Take 1 tablet by mouth daily.   calcium carbonate 500 MG chewable tablet Commonly known as: TUMS - dosed in mg elemental calcium Chew 1 tablet by mouth 3 (three) times daily as needed for indigestion or heartburn.   CALCIUM MAGNESIUM PO Take 1 tablet by mouth daily.   carvedilol 6.25 MG tablet Commonly known as: COREG TAKE 1 TABLET (6.25 MG TOTAL) BY MOUTH 2 (TWO) TIMES DAILY WITH A MEAL.   cetirizine 10 MG tablet Commonly known as: ZYRTEC Take 10 mg by mouth daily.   clopidogrel 75 MG tablet Commonly known as: PLAVIX TAKE 1 TABLET BY MOUTH EVERY DAY   CoQ-10 100 MG Caps Take 100 mg by mouth daily.  ezetimibe 10 MG tablet Commonly known as: ZETIA Take 1 tablet (10 mg total) by mouth daily.   famotidine 20 MG tablet Commonly known as: PEPCID Take 20 mg by mouth daily.   FLAXSEED-EVE PRIM-BORAGE PO Take 1 capsule by mouth daily.   multivitamin tablet Take 1 tablet by mouth daily.   nitroGLYCERIN 0.4 MG SL tablet Commonly known as: Nitrostat PLACE 1 TABLET UNDER THE TONGUE EVERY 5 (FIVE) MINUTES AS NEEDED FOR CHEST PAIN.   Xarelto 20 MG Tabs tablet Generic drug: rivaroxaban TAKE 1 TABLET (20 MG TOTAL) BY MOUTH DAILY WITH SUPPER.   zinc gluconate 50 MG tablet Take 50 mg by mouth daily.           Outstanding Labs/Studies   None  Duration of Discharge Encounter   Greater than 30 minutes including physician time.  Signed, Beatriz StallionKrista M. Kroeger, PA-C 03/19/2020, 11:41 AM  Agree with assessment and plan by Judy PimpleKrista Kroeger PA-C  [name] was admitted with chest pain and non-STEMI.  He was on Xarelto as an outpatient.  He underwent cardiac cath, PCI and drug-eluting stenting of a mid AV groove circumflex "in-stent restenosis" with excellent result.  Has had no recurrent symptoms.  His exam was benign today.  He stable for discharge home.  He can start his Xarelto tonight.  He was discharged on aspirin and Plavix.  He may be a candidate for A. fib ablation  in the future.  Runell GessJonathan J. Pj Zehner, M.D., FACP, [facility] ColoFACC, Earl LagosFAHA, [facility] CenterFSCAI [facility] [address] [place], [place]  [id number]  [phone] 03/19/2020 12:01 PM

## 2020-03-19 NOTE — Plan of Care (Signed)

## 2020-03-19 NOTE — Progress Notes (Signed)
Progress Note  Patient Name: [name] Date of Encounter: 03/19/2020  [facility] Cardiologist: Verne Carrow, MD   Subjective   [name] was admitted yesterday with a non-STEMI.  He was in A. fib with RVR and currently is in sinus rhythm on IV amiodarone .  He said no further chest pain.  He was on Xarelto prior to admission because of PAF which has been held.  Cardiac cath revealed a 99% in-stent restenotic lesion within the AV groove circumflex long stent which was restented.  He said no chest pain overnight and walked with cardiac rehab without symptoms this morning.  Inpatient Medications    Scheduled Meds: . aspirin EC  81 mg Oral Daily  . atorvastatin  80 mg Oral Daily  . carvedilol  6.25 mg Oral BID WC  . clopidogrel  75 mg Oral Daily  . ezetimibe  10 mg Oral Daily  . sodium chloride flush  3 mL Intravenous Q12H   Continuous Infusions: . sodium chloride    . amiodarone 30 mg/hr (03/19/20 0408)   PRN Meds: sodium chloride, acetaminophen, nitroGLYCERIN, ondansetron (ZOFRAN) IV, sodium chloride flush   Vital Signs    Vitals:   03/18/20 1935 03/18/20 2338 03/19/20 0347 03/19/20 0748  BP: 112/71 108/76 (!) 134/96 125/87  Pulse: 77 70 78 84  Resp: 20 20 20 14   Temp: 97.8 F (36.6 C) 98.2 F (36.8 C) 98 F (36.7 C) 99.3 F (37.4 C)  TempSrc: Oral Oral Oral Oral  SpO2: 96% 96% 97% 97%  Weight:      Height:        Intake/Output Summary (Last 24 hours) at 03/19/2020 0857 Last data filed at 03/19/2020 0400 Gross per 24 hour  Intake 465.33 ml  Output 1550 ml  Net -1084.67 ml   Last 3 Weights 03/18/2020 03/17/2020 03/17/2020  Weight (lbs) 216 lb 4.3 oz 217 lb 9.5 oz 246 lb 14.6 oz  Weight (kg) 98.1 kg 98.7 kg 112 kg      Telemetry    Sinus rhythm- Personally Reviewed  ECG    Sinus rhythm at 79 without ST or T wave changes-personally Reviewed  Physical Exam   GEN: No acute distress.   Neck: No JVD Cardiac: RRR, no murmurs, rubs, or gallops.   Respiratory: Clear to auscultation bilaterally. GI: Soft, nontender, non-distended  MS: No edema; No deformity. Neuro:  Nonfocal  Psych: Normal affect  Extremities: Right radial puncture site intact  Labs    High Sensitivity Troponin:   Recent Labs  Lab 03/17/20 0107 03/17/20 0329  TROPONINIHS 97* 1,224*      Chemistry Recent Labs  Lab 03/17/20 0107 03/18/20 0416 03/19/20 0039  NA 140 139 137  K 4.5 3.8 4.2  CL 101 106 108  CO2 28 23 19*  GLUCOSE 94 85 98  BUN 10 10 10   CREATININE 1.05 1.04 0.91  CALCIUM 9.9 8.8* 8.4*  GFRNONAA >60 >60 >60  ANIONGAP 11 10 10      Hematology Recent Labs  Lab 03/17/20 0107 03/19/20 0039  WBC 7.5 6.0  RBC 4.82 4.31  HGB 15.5 13.7  HCT 44.7 40.8  MCV 92.7 94.7  MCH 32.2 31.8  MCHC 34.7 33.6  RDW 13.9 14.2  PLT 208 146*    BNPNo results for input(s): BNP, PROBNP in the last 168 hours.   DDimer No results for input(s): DDIMER in the last 168 hours.   Radiology    CARDIAC CATHETERIZATION  Result Date: 03/18/2020  1st  Diag lesion is 30% stenosed.  Prox LAD to Mid LAD lesion is 30% stenosed.  Ost Cx to Prox Cx lesion is 60% stenosed.  Mid RCA lesion is 60% stenosed.  Previously placed Ramus drug eluting stent is widely patent.  Prox Cx to Mid Cx lesion is 99% stenosed.  A drug-eluting stent was successfully placed using a STENT RESOLUTE ONYX [id number].  Post intervention, there is a 0% residual stenosis.  Post intervention, there is a 0% residual stenosis.  1. Patent mid LAD stent with minimal restenosis 2. Patent intermediate stent 3. Moderately severe proximal Circumflex stenosis. Patent mid Circumflex stent with severe restenosis within the old stent. 4. Successful PTCA/DES x 1 proximal and mid Circumflex 5. Moderate mid RCA stenosis angiographically unchanged since cath in 2020. Recommendations: Continue DAPT with ASA and Plavix for at least one month. Consider stopping ASA in one month given the fact that he will be on  Xarelto as well. Resume Xarelto tomorrow.   ECHOCARDIOGRAM COMPLETE  Result Date: 03/18/2020    ECHOCARDIOGRAM REPORT   Patient Name:   [name] Date of Exam: 03/18/2020 Medical Rec #:  [id number]    Height:       70.0 in Accession #:    [id number]   Weight:       217.6 lb Date of Birth:  May 24, 1960    BSA:          2.163 m Patient Age:    59 years     BP:           127/83 mmHg Patient Gender: M            HR:           81 bpm. Exam Location:  Inpatient Procedure: 2D Echo, Cardiac Doppler and Color Doppler Indications:    R07.9* Chest pain, unspecified  History:        Patient has prior history of Echocardiogram examinations, most                 recent 01/03/2019. CAD; Risk Factors:Hypertension and ETOH.  Sonographer:    Eulah Pont RDCS Referring Phys: [phone] LINDSAY B ROBERTS IMPRESSIONS  1. Left ventricular ejection fraction, by estimation, is 60 to 65%. The left ventricle has normal function. The left ventricle has no regional wall motion abnormalities. Left ventricular diastolic parameters were normal.  2. Right ventricular systolic function is normal. The right ventricular size is normal. Tricuspid regurgitation signal is inadequate for assessing PA pressure.  3. The mitral valve is normal in structure. Trivial mitral valve regurgitation.  4. The aortic valve is tricuspid. Aortic valve regurgitation is not visualized. No aortic stenosis is present.  5. The inferior vena cava is normal in size with greater than 50% respiratory variability, suggesting right atrial pressure of 3 mmHg. FINDINGS  Left Ventricle: Left ventricular ejection fraction, by estimation, is 60 to 65%. The left ventricle has normal function. The left ventricle has no regional wall motion abnormalities. The left ventricular internal cavity size was normal in size. There is  no left ventricular hypertrophy. Left ventricular diastolic parameters were normal. Right Ventricle: The right ventricular size is normal. No increase in right  ventricular wall thickness. Right ventricular systolic function is normal. Tricuspid regurgitation signal is inadequate for assessing PA pressure. Left Atrium: Left atrial size was normal in size. Right Atrium: Right atrial size was normal in size. Pericardium: There is no evidence of pericardial effusion. Mitral Valve: The mitral valve is normal in structure.  Trivial mitral valve regurgitation. Tricuspid Valve: The tricuspid valve is normal in structure. Tricuspid valve regurgitation is trivial. Aortic Valve: The aortic valve is tricuspid. Aortic valve regurgitation is not visualized. No aortic stenosis is present. Pulmonic Valve: The pulmonic valve was not well visualized. Pulmonic valve regurgitation is not visualized. Aorta: The aortic root and ascending aorta are structurally normal, with no evidence of dilitation. Venous: The inferior vena cava is normal in size with greater than 50% respiratory variability, suggesting right atrial pressure of 3 mmHg. IAS/Shunts: The interatrial septum was not well visualized.  LEFT VENTRICLE PLAX 2D LVIDd:         4.50 cm  Diastology LVIDs:         3.30 cm  LV e' medial:    7.71 cm/s LV PW:         1.00 cm  LV E/e' medial:  12.2 LV IVS:        0.80 cm  LV e' lateral:   9.81 cm/s LVOT diam:     1.90 cm  LV E/e' lateral: 9.6 LV SV:         49 LV SV Index:   23 LVOT Area:     2.84 cm  RIGHT VENTRICLE RV S prime:     10.40 cm/s TAPSE (M-mode): 2.1 cm LEFT ATRIUM             Index       RIGHT ATRIUM           Index LA diam:        3.40 cm 1.57 cm/m  RA Area:     11.10 cm LA Vol (A2C):   45.0 ml 20.80 ml/m RA Volume:   21.10 ml  9.75 ml/m LA Vol (A4C):   49.6 ml 22.93 ml/m LA Biplane Vol: 50.2 ml 23.20 ml/m  AORTIC VALVE LVOT Vmax:   89.40 cm/s LVOT Vmean:  66.000 cm/s LVOT VTI:    0.174 m  AORTA Ao Root diam: 3.20 cm Ao Asc diam:  2.90 cm MITRAL VALVE MV Area (PHT): 3.65 cm    SHUNTS MV Decel Time: 208 msec    Systemic VTI:  0.17 m MV E velocity: 93.80 cm/s  Systemic Diam:  1.90 cm MV A velocity: 37.30 cm/s MV E/A ratio:  2.51 Joseph Lescheshristopher Schumann MD Electronically signed by Joseph Lescheshristopher Schumann MD Signature Date/Time: 03/18/2020/11:04:57 AM    Final     Cardiac Studies   2D echocardiogram (03/18/2020)  IMPRESSIONS    1. Left ventricular ejection fraction, by estimation, is 60 to 65%. The  left ventricle has normal function. The left ventricle has no regional  wall motion abnormalities. Left ventricular diastolic parameters were  normal.  2. Right ventricular systolic function is normal. The right ventricular  size is normal. Tricuspid regurgitation signal is inadequate for assessing  PA pressure.  3. The mitral valve is normal in structure. Trivial mitral valve  regurgitation.  4. The aortic valve is tricuspid. Aortic valve regurgitation is not  visualized. No aortic stenosis is present.  5. The inferior vena cava is normal in size with greater than 50%  respiratory variability, suggesting right atrial pressure of 3 mmHg.  Cardiac catheterization/PCI and stent (03/18/2020)  Conclusion    1st Diag lesion is 30% stenosed.  Prox LAD to Mid LAD lesion is 30% stenosed.  Ost Cx to Prox Cx lesion is 60% stenosed.  Mid RCA lesion is 60% stenosed.  Previously placed Ramus drug eluting stent is widely patent.  Prox  Cx to Mid Cx lesion is 99% stenosed.  A drug-eluting stent was successfully placed using a STENT RESOLUTE ONYX [id number].  Post intervention, there is a 0% residual stenosis.  Post intervention, there is a 0% residual stenosis.   1. Patent mid LAD stent with minimal restenosis 2. Patent intermediate stent 3. Moderately severe proximal Circumflex stenosis. Patent mid Circumflex stent with severe restenosis within the old stent.  4. Successful PTCA/DES x 1 proximal and mid Circumflex 5. Moderate mid RCA stenosis angiographically unchanged since cath in 2020.   Recommendations: Continue DAPT with ASA and Plavix for at least one  month. Consider stopping ASA in one month given the fact that he will be on Xarelto as well. Resume Xarelto tomorrow.    Coronary Diagrams   Diagnostic Dominance: Right    Intervention       Patient Profile     [name] is a 60 y.o. male with STEMI 2009 status post bare-metal stent to LAD, cath 2013 with overlapping DES to OM1, cath 2020 with DES to RI, hypertension, hyperlipidemia, EtOH and PAF use who presented with chest pain.   Assessment & Plan    1: CAD/non-STEMI-history of CAD status post multiple interventions in the past most recently in 2020 with DES to ramus branch.  He was admitted with chest pain in the setting of A. fib with RVR which has since resolved as has his chest pain.  Troponins rose to a  thousand.  He had diagnostic cath performed yesterday by Dr. Clifton James via the right radial approach that showed a patent RCA stent, 99% in-stent restenosis within the mid AV groove circumflex, and a patent ramus stent.  He underwent restenting of the AV groove circumflex with an excellent result.  Has been pain-free overnight.  2: PAF-initial EKG showed A. fib with RVR, currently he is in sinus rhythm.  He was on carvedilol.  He apparently had an episode of PAF last night as well.  We will restart the Xarelto tonight.  He was on IV amiodarone which we will convert to p.o.  He may benefit from consideration of A. fib ablation in the future.  3: Nonsustained ventricular tachycardia-he had a 30 beat run of nonsustained VT.  He is currently on amiodarone.  This may have been ischemically mediated given his elevated troponins.  It is not clear whether he will need to continue this.  No further nonsustained VT.  4: Essential hypertension-continue carvedilol twice daily  5: Hyperlipidemia-on high-dose atorvastatin and Zetia with an LDL 54  Postop day #1 PCI and drug-eluting stenting of high-grade mid AV groove circumflex in-stent restenosis.  Patient has ambulated without  symptoms.  He is remained in sinus rhythm except for a short episode of PAF last night which is not captured on telemetry.  His right radial puncture site is stable.  Plan discharge home later today on low-dose aspirin, Plavix and Xarelto which will start this evening as well as oral amiodarone.  We will arrange follow-up/TOC 7 after which she will see Dr. Clifton James back  For questions or updates, please contact [facility] Please consult [email or web address] for contact info under        Signed, Nanetta Batty, MD  03/19/2020, 8:57 AM

## 2020-03-19 NOTE — Discharge Instructions (Signed)

## 2020-03-19 NOTE — Progress Notes (Signed)
CARDIAC REHAB PHASE I   PRE:  Rate/Rhythm: 82 SR  BP:  Supine: 124/85  Sitting:   Standing:    SaO2: 96%RA  MODE:  Ambulation: 400 ft   POST:  Rate/Rhythm: 98 SR  BP:  Supine:   Sitting: 137/92  Standing:    SaO2: 99%RA 0740-0845 Pt walked 400 ft on RA with steady gait and tolerated well. No c/o CP but a little SOB after walk. Sats good on RA at 99%. No VT noted during walk. MI education completed with pt who voiced understanding. Stressed importance of plavix with stent. Reviewed NTG use, MI restrictions, walking for ex, heart healthy food choices, and CRP 2. Pt has attended program before. Will refer to [place]. To recliner after walk .   Luetta Nutting, RN BSN  03/19/2020 8:42 AM

## 2020-03-19 NOTE — Progress Notes (Signed)
Pt provided with verbal discharge instructions. RN answered all questions. VSS at discharge. IV's removed. Pt belongings sent with patient. NT discharge patient via wheelchair to private vehicle for discharge.

## 2020-03-20 ENCOUNTER — Telehealth: Payer: Self-pay

## 2020-03-20 NOTE — Telephone Encounter (Signed)
Attempted to contact patient to discuss TOC questions.  Patient did not answer, LVM to call back to discuss, left call back number.

## 2020-03-20 NOTE — Telephone Encounter (Signed)
-----   Message from Lorelle Formosa Via, LPN sent at 03/02/8655 11:34 AM EST ----- Regarding: FW: TOC call The pt is following up at our [facility] office. Forwarding to [facility] and triage pools. Thanks  ----- Message ----- From: Beatriz Stallion., PA-C Sent: 03/19/2020  11:29 AM EST To: [facility] Subject: TOC call                                       Hey there! Please place a TOC call for this patient. No APP visits at [facility] so he will see Edd Fabian 3/3 at 11:15. Thanks!

## 2020-03-25 NOTE — Telephone Encounter (Signed)
Attempted to contact patient again to discuss TOC questions.  Advised patient to call back. Left call back number.

## 2020-03-26 ENCOUNTER — Other Ambulatory Visit: Payer: Self-pay | Admitting: Medical

## 2020-03-31 NOTE — Telephone Encounter (Signed)
Left detailed message for the patient of appointment date, time and location. He is to call with questions.

## 2020-04-01 NOTE — Progress Notes (Signed)
Cardiology Clinic Note   Patient Name: [name] Date of Encounter: 04/02/2020  Primary Care Provider:  Verlon Au, MD Primary Cardiologist:  Joseph Carrow, MD  Patient Profile    [name] 60 year old male presents in the clinic today for follow-up evaluation of his coronary artery disease status post NSTEMI 03/18/2020.  Past Medical History    Past Medical History:  Diagnosis Date  . Coronary artery disease    a. 04/2007 - Ant Stemi: LM: nl, LAD: 100p ( 2.75 x 28 Vision BMS ), LCX: 70-75, RI: 40/50, RCA: 43m, EF 35-40%.;  b. 01/2011 - Abnl ETT (Inf-Lat ST dep);  c.  02/2011 - Ex MV:  Dist ant-sept Infarct.  No ischemia.;  d. 03/30/11 NSTEMI - Cath: 99 LCX into OM1, otw nonobs dzs & NL LV - LCX stented w/ 2 promus DES  . Habitual alcohol use    a. at least a 12 pack/wk (03/30/2011)  . Hypercholesteremia   . Hypertension   . PUD (peptic ulcer disease)    in his 64s   Past Surgical History:  Procedure Laterality Date  . CARDIAC CATHETERIZATION  01/18/2019  . CORONARY STENT INTERVENTION  01/18/2019  . CORONARY STENT INTERVENTION N/A 01/18/2019   Procedure: CORONARY STENT INTERVENTION;  Surgeon: Joseph Kendall, MD;  Location: [facility];  Service: Cardiovascular;  Laterality: N/A;  . CORONARY STENT INTERVENTION N/A 03/18/2020   Procedure: CORONARY STENT INTERVENTION;  Surgeon: Joseph Hazel, MD;  Location: [facility];  Service: Cardiovascular;  Laterality: N/A;  . INTRAVASCULAR PRESSURE WIRE/FFR STUDY N/A 01/18/2019   Procedure: INTRAVASCULAR PRESSURE WIRE/FFR STUDY;  Surgeon: Joseph Kendall, MD;  Location: [facility];  Service: Cardiovascular;  Laterality: N/A;  . LEFT HEART CATH AND CORONARY ANGIOGRAPHY N/A 01/18/2019   Procedure: LEFT HEART CATH AND CORONARY ANGIOGRAPHY;  Surgeon: Joseph Kendall, MD;  Location: [facility];  Service: Cardiovascular;  Laterality: N/A;  . LEFT HEART CATH AND CORONARY ANGIOGRAPHY N/A  03/18/2020   Procedure: LEFT HEART CATH AND CORONARY ANGIOGRAPHY;  Surgeon: Joseph Hazel, MD;  Location: [facility];  Service: Cardiovascular;  Laterality: N/A;  . LEFT HEART CATHETERIZATION WITH CORONARY ANGIOGRAM N/A 03/30/2011   Procedure: LEFT HEART CATHETERIZATION WITH CORONARY ANGIOGRAM;  Surgeon: Joseph Hazel, MD;  Location: [name];  Service: Cardiovascular;  Laterality: N/A;  . None      Allergies  No Known Allergies  History of Present Illness    [name] has a PMH of hypertension, hyperlipidemia, paroxysmal atrial fibrillation, EtOH abuse, and coronary artery disease.  He is status post stenting 2009 with bare-metal stent to LAD and subsequent PCI with DES to OM1 in 2013 and PCI with DES to ramus intermedius in 2020.  He was admitted in 2013 with unstable angina underwent cardiac catheterization with severe stenosis of OM1 treated with overlapping DES.  There was residual stenosis of the diagonal vessel 50% and 20% RCA.  He presented with unstable angina 11/20 and underwent stress testing which was noted to be high risk.  He underwent cardiac catheterization which showed 70-80% stenosis in the ramus intermedius was successful PCI and DES.  He did have 40% residual stenosis in his proximal circumflex and 60% stenosis in his RCA which was not found to be not hemodynamically significant by DFR.  Medical management was recommended.  He was continued on dual antiplatelet therapy with recommendations for lifelong therapy and aggressive secondary prevention.  He presented  to the emergency department on 03/16/2020 with episodes of chest pain.  He reported he was able to work that day, came home, and ate dinner.  Shortly after eating he reported chest ache type sensation on the left side of his chest with radiation down bilateral arms.  He reported that his symptoms were very similar to previous episodes which required stenting.  He reported that he felt like his  heart was out of rhythm.  He also reported shortness of breath.  In the emergency department his labs showed a creatinine of 1.05, high-sensitivity troponins 97-1224, Covid negative, chest x-ray negative, initial EKG showed atrial fibrillation with RVR 137 bpm with ST depression in lateral leads.  His follow-up EKG showed conversion to sinus rhythm with no acute ST or T wave abnormalities.  While in the emergency department he had a episode of 30 beats of nonsustained VT and was asymptomatic.  He was started on IV amiodarone.  He reported that his last dose of Eliquis was at 8 PM 03/16/2020.  He underwent cardiac catheterization 03/18/2020 which showed first diagonal 30% stenosis, proximal-mid LAD 30% stenosis, circumflex proximal circumflex lesion 60% stenosis, mid RCA 60% stenosis, widely patent ramus DES, proximal-mid circumflex lesion 99% stenosis.  He received a DES to his proximal-mid circumflex.  By echocardiogram EF was noted to be 60-65%.  Trivial mitral valve regurgitation was also noted.  His Xarelto was restarted evening of 03/19/2020 and he was placed on amiodarone for rate control anticipating taper to 200 mg daily at OP follow-up.  He presents the clinic today for follow-up evaluation states he feels well.  He does not that he had one 10-minute episode of irregular heartbeat on 03/31/2020.  He reports going back to regular rhythm without intervention/resting.  We reviewed his angiography, medications, and activity.  He reports that he is planning to start exercising again.  His EKG today shows normal sinus rhythm patient, incomplete right bundle branch block.  I will continue his amiodarone 200 mg twice daily until the end of the week and then he may reduce his dosing to 200 mg daily.  On 04/15/2020 we will have him stop his aspirin.  I will give him any salty 6 diet sheet, have him increase his physical activity as tolerated, and follow-up with [name] in 3 months.  Today he denies chest pain,  shortness of breath, lower extremity edema, fatigue, palpitations, melena, hematuria, hemoptysis, diaphoresis, weakness, presyncope, syncope, orthopnea, and PND.     Home Medications    Prior to Admission medications   Medication Sig Start Date End Date Taking? Authorizing Provider  amiodarone (PACERONE) 200 MG tablet Take 1 tablet (200 mg total) by mouth 2 (two) times daily. 03/19/20   Kroeger, Ovidio Kin., PA-C  aspirin 81 MG tablet Take 81 mg by mouth daily.    [provider]  atorvastatin (LIPITOR) 80 MG tablet TAKE 1 TABLET BY MOUTH EVERY DAY Patient taking differently: Take 80 mg by mouth at bedtime. 11/12/19   Joseph Hazel, MD  b complex vitamins tablet Take 1 tablet by mouth daily.     [provider]  calcium carbonate (TUMS - DOSED IN MG ELEMENTAL CALCIUM) 500 MG chewable tablet Chew 1 tablet by mouth 3 (three) times daily as needed for indigestion or heartburn.    [provider]  Calcium-Magnesium-Vitamin D (CALCIUM MAGNESIUM PO) Take 1 tablet by mouth daily.    [provider]  carvedilol (COREG) 6.25 MG tablet TAKE 1 TABLET (6.25 MG  TOTAL) BY MOUTH 2 (TWO) TIMES DAILY WITH A MEAL. 02/14/19   Joseph HazelMcAlhany, Christopher D, MD  cetirizine (ZYRTEC) 10 MG tablet Take 10 mg by mouth daily.    [provider]  clopidogrel (PLAVIX) 75 MG tablet TAKE 1 TABLET BY MOUTH EVERY DAY 01/06/20   Joseph HazelMcAlhany, Christopher D, MD  Coenzyme Q10 (COQ-10) 100 MG CAPS Take 100 mg by mouth daily.    [provider]  ezetimibe (ZETIA) 10 MG tablet Take 1 tablet (10 mg total) by mouth daily. 05/29/19   Joseph HazelMcAlhany, Christopher D, MD  famotidine (PEPCID) 20 MG tablet Take 20 mg by mouth daily.    [provider]  FLAXSEED-EVE PRIM-BORAGE PO Take 1 capsule by mouth daily.     [provider]  Multiple Vitamin (MULTIVITAMIN) tablet Take 1 tablet by mouth daily.    [provider]  nitroGLYCERIN (NITROSTAT) 0.4 MG SL tablet PLACE 1  TABLET UNDER THE TONGUE EVERY 5 (FIVE) MINUTES AS NEEDED FOR CHEST PAIN. 03/19/20   Kroeger, Dot LanesKrista M., PA-C  XARELTO 20 MG TABS tablet TAKE 1 TABLET (20 MG TOTAL) BY MOUTH DAILY WITH SUPPER. 02/03/20   Joseph HazelMcAlhany, Christopher D, MD  zinc gluconate 50 MG tablet Take 50 mg by mouth daily.    [provider]    Family History    Family History  Problem Relation Age of Onset  . Coronary artery disease Mother [id number]       alive @ 2477  . Coronary artery disease Father        had MI in his 3760's - died @ 7187  . Heart attack Brother 51       alive - 7654   He indicated that his mother is alive. He indicated that his father is deceased. He indicated that the status of his brother is unknown. He indicated that his maternal grandmother is deceased. He indicated that his maternal grandfather is deceased. He indicated that his paternal grandmother is deceased. He indicated that his paternal grandfather is deceased.  Social History    Social History   Socioeconomic History  . Marital status: Single    Spouse name: Not on file  . Number of children: 0  . Years of education: Not on file  . Highest education level: Not on file  Occupational History  . 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 Smoking status: Never Smoker  . Smokeless tobacco: Never Used  Vaping Use  . Vaping Use: Never used  Substance and Sexual Activity  . Alcohol use: Yes    Alcohol/week: 10.0 standard drinks    Types: 10 Standard drinks or equivalent per week    Comment: 2 six packs a week, mostly on the weekends  . Drug use: No  . Sexual activity: Not on file  Other Topics Concern  . Not on file  Social History Narrative   [place] in [place].  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   Social Determinants of Health   Financial Resource Strain: Not on file  Food Insecurity: Not on file  Transportation Needs: Not on file  Physical Activity: Not on file  Stress: Not  on file  Social Connections: Not on file  Intimate Partner Violence: Not on file     Review of Systems    General:  No chills, fever, night sweats or weight changes.  Cardiovascular:  No chest pain, dyspnea on exertion, edema, orthopnea, palpitations, paroxysmal nocturnal dyspnea.  Dermatological: No rash, lesions/masses Respiratory: No cough, dyspnea Urologic: No hematuria, dysuria Abdominal:   No nausea, vomiting, diarrhea, bright red blood per rectum, melena, or hematemesis Neurologic:  No visual changes, wkns, changes in mental status. All other systems reviewed and are otherwise negative except as noted above.  Physical Exam    VS:  BP 118/76 (BP Location: Left Arm, Patient Position: Sitting, Cuff Size: Normal)   Pulse 70   Ht 5\' 10"  (1.778 m)   Wt 223 lb 6.4 oz (101.3 kg)   BMI 32.05 kg/m  , BMI Body mass index is 32.05 kg/m. GEN: Well nourished, well developed, in no acute distress. HEENT: normal. Neck: Supple, no JVD, carotid bruits, or masses. Cardiac: RRR, no murmurs, rubs, or gallops. No clubbing, cyanosis, edema.  Radials/DP/PT 2+ and equal bilaterally.  Respiratory:  Respirations regular and unlabored, clear to auscultation bilaterally. GI: Soft, nontender, nondistended, BS + x 4. MS: no deformity or atrophy. Skin: warm and dry, no rash. Neuro:  Strength and sensation are intact. Psych: Normal affect.  Accessory Clinical Findings    Recent Labs: 03/17/2020: Magnesium 2.1 03/19/2020: BUN 10; Creatinine, Ser 0.91; Hemoglobin 13.7; Platelets 146; Potassium 4.2; Sodium 137   Recent Lipid Panel    Component Value Date/Time   CHOL 126 03/18/2020 0416   TRIG 138 03/18/2020 0416   HDL 44 03/18/2020 0416   CHOLHDL 2.9 03/18/2020 0416   VLDL 28 03/18/2020 0416   LDLCALC 54 03/18/2020 0416    ECG personally reviewed by me today-normal sinus rhythm rightward axis deviation incomplete right bundle branch block 70 bpm- No acute changes  Echocardiogram  03/18/2020 IMPRESSIONS    1. Left ventricular ejection fraction, by estimation, is 60 to 65%. The  left ventricle has normal function. The left ventricle has no regional  wall motion abnormalities. Left ventricular diastolic parameters were  normal.  2. Right ventricular systolic function is normal. The right ventricular  size is normal. Tricuspid regurgitation signal is inadequate for assessing  PA pressure.  3. The mitral valve is normal in structure. Trivial mitral valve  regurgitation.  4. The aortic valve is tricuspid. Aortic valve regurgitation is not  visualized. No aortic stenosis is present.  5. The inferior vena cava is normal in size with greater than 50%  respiratory variability, suggesting right atrial pressure of 3 mmHg.   Cardiac catheterization 03/18/2020  1st Diag lesion is 30% stenosed.  Prox LAD to Mid LAD lesion is 30% stenosed.  Ost Cx to Prox Cx lesion is 60% stenosed.  Mid RCA lesion is 60% stenosed.  Previously placed Ramus drug eluting stent is widely patent.  Prox Cx to Mid Cx lesion is 99% stenosed.  A drug-eluting stent was successfully placed using a STENT RESOLUTE ONYX 03/20/2020.  Post intervention, there is a 0% residual stenosis.  Post intervention, there is a 0% residual stenosis.   1. Patent mid LAD stent with minimal restenosis 2. Patent intermediate stent 3. Moderately severe proximal Circumflex stenosis. Patent mid Circumflex stent with severe restenosis within the old stent.  4. Successful PTCA/DES x 1 proximal and mid Circumflex 5. Moderate mid RCA stenosis angiographically unchanged since cath in 2020.   Recommendations: Continue DAPT with ASA and Plavix for at least one month. Consider stopping ASA in one month given the fact that he will be on Xarelto as well. Resume Xarelto tomorrow.   Diagnostic Dominance: Right    Intervention       Assessment & Plan   1.  NSTEMI-no  chest pain today.  No episodes of chest  discomfort or arm discomfort since leaving the [facility].  Status post cardiac catheterization 03/18/2020 with overlapping DES x1 to circumflex.  Denies bleeding issues.  Cardiac cath site clean dry intact no drainage. Continue aspirin x1 month then discontinue (04/15/2020) -on Xarelto Continue Plavix, Xarelto, nitroglycerin, atorvastatin, carvedilol, co-Q10 Heart healthy low-sodium diet-salty 6 given Increase physical activity as tolerated  PAF-reports one episode of irregular heartbeat that lasted for 10 minutes on 03/31/2020.  EKG today shows normal sinus rhythm rightward axis deviation incomplete right bundle branch block 70 bpm.  Initial EKG on presentation to emergency department showed atrial fibrillation with RVR.  Converted to sinus rhythm and had a brief run of NSVT which was asymptomatic, felt to be related to ischemia. Continue Xarelto, carvedilol Decrease amiodarone to 200 mg daily at the end of the week. Heart healthy low-sodium diet-salty 6 given Increase physical activity as tolerated Refer to A. fib clinic  Essential hypertension-BP today 118/76.  Well-controlled at home. Continue carvedilol Heart healthy low-sodium diet-salty 6 given Increase physical activity as tolerated  Hyperlipidemia-03/18/2020: Cholesterol 126; HDL 44; LDL Cholesterol 54; Triglycerides 138; VLDL 28 Continue atorvastatin, co-Q10 Heart healthy low-sodium high-fiber diet Increase physical activity as tolerated  Disposition: Follow-up with  [name] in 3 months.  Thomasene Ripple. Shaquayla Klimas NP-C    04/02/2020, 12:26 PM [facility] [address] Office [phone] Fax [phone]  Notice: This dictation was prepared with Dragon dictation along with smaller phrase technology. Any transcriptional errors that result from this process are unintentional and may not be corrected upon review.  I spent 15 minutes examining this patient, reviewing medications, and using patient  centered shared decision making involving her cardiac care.  Prior to her visit I spent greater than 20 minutes reviewing her past medical history,  medications, and prior cardiac tests.

## 2020-04-02 ENCOUNTER — Ambulatory Visit (INDEPENDENT_AMBULATORY_CARE_PROVIDER_SITE_OTHER): Payer: 59 | Admitting: General Practice

## 2020-04-02 ENCOUNTER — Encounter: Payer: Self-pay | Admitting: General Practice

## 2020-04-02 ENCOUNTER — Other Ambulatory Visit: Payer: Self-pay

## 2020-04-02 VITALS — BP 118/76 | HR 70 | Ht 70.0 in | Wt 223.4 lb

## 2020-04-02 DIAGNOSIS — I214 Non-ST elevation (NSTEMI) myocardial infarction: Secondary | ICD-10-CM | POA: Diagnosis not present

## 2020-04-02 DIAGNOSIS — E7800 Pure hypercholesterolemia, unspecified: Secondary | ICD-10-CM | POA: Diagnosis not present

## 2020-04-02 DIAGNOSIS — I10 Essential (primary) hypertension: Secondary | ICD-10-CM | POA: Diagnosis not present

## 2020-04-02 DIAGNOSIS — I480 Paroxysmal atrial fibrillation: Secondary | ICD-10-CM | POA: Diagnosis not present

## 2020-04-02 MED ORDER — AMIODARONE HCL 200 MG PO TABS: 200.0000 mg | ORAL_TABLET | Freq: Every day | ORAL | 3 refills | Status: DC

## 2020-04-02 NOTE — Patient Instructions (Signed)
Medication Instructions:  STOP ASPIRIN ON 04-15-2020  TAKE AMIODARONE 200MG  TWICE DAILY THIS WEEK THEN DECREASE TO 200MG  DAILY *If you need a refill on your cardiac medications before your next appointment, please call your pharmacy*  Lab Work:   Testing/Procedures:  NONE    NONE  Special Instructions WATCH FOR BLEEDING-STOOL, ETC...  PLEASE READ AND FOLLOW SALTY 6-ATTACHED-1,800mg  daily  PLEASE INCREASE PHYSICAL ACTIVITY AS TOLERATED  Follow-Up: Your next appointment:  3 month(s) In Person with You may see , MD IF UNAVAILABLE JESSE CLEAVER, FNP-C  or one of the following Advanced Practice Providers on your designated Care Team:  , PA-C  Verne Carrow, PA-C  At [facility], you and your health needs are our priority.  As part of our continuing mission to provide you with exceptional heart care, we have created designated Provider Care Teams.  These Care Teams include your primary Cardiologist (physician) and Advanced Practice Providers (APPs -  Physician Assistants and Nurse Practitioners) who all work together to provide you with the care you need, when you need it.  We recommend signing up for the patient portal called "MyChart".  Sign up information is provided on this After Visit Summary.  MyChart is used to connect with patients for Virtual Visits (Telemedicine).  Patients are able to view lab/test results, encounter notes, upcoming appointments, etc.  Non-urgent messages can be sent to your provider as well.   To learn more about what you can do with MyChart, go to Jacolyn Reedy.              6 SALTY THINGS TO AVOID     1,800MG  DAILY

## 2020-04-17 ENCOUNTER — Telehealth (HOSPITAL_COMMUNITY): Payer: Self-pay

## 2020-04-17 NOTE — Telephone Encounter (Signed)
Called and spoke with pt in regards to CR, pt stated he is not able to participate at this time due to his copay.  Closed referral

## 2020-05-08 ENCOUNTER — Other Ambulatory Visit: Payer: Self-pay | Admitting: Cardiovascular Disease

## 2020-05-28 ENCOUNTER — Other Ambulatory Visit: Payer: Self-pay | Admitting: Cardiovascular Disease

## 2020-06-29 ENCOUNTER — Other Ambulatory Visit: Payer: Self-pay | Admitting: General Practice

## 2020-07-30 ENCOUNTER — Encounter: Payer: Self-pay | Admitting: Cardiovascular Disease

## 2020-07-30 ENCOUNTER — Other Ambulatory Visit: Payer: Self-pay

## 2020-07-30 ENCOUNTER — Ambulatory Visit (INDEPENDENT_AMBULATORY_CARE_PROVIDER_SITE_OTHER): Payer: 59 | Admitting: Cardiovascular Disease

## 2020-07-30 VITALS — BP 102/66 | HR 65 | Ht 70.0 in | Wt 229.0 lb

## 2020-07-30 DIAGNOSIS — I480 Paroxysmal atrial fibrillation: Secondary | ICD-10-CM | POA: Diagnosis not present

## 2020-07-30 DIAGNOSIS — I10 Essential (primary) hypertension: Secondary | ICD-10-CM

## 2020-07-30 DIAGNOSIS — E7800 Pure hypercholesterolemia, unspecified: Secondary | ICD-10-CM

## 2020-07-30 DIAGNOSIS — I2510 Atherosclerotic heart disease of native coronary artery without angina pectoris: Secondary | ICD-10-CM

## 2020-07-30 NOTE — Patient Instructions (Signed)
Medication Instructions:  Your physician recommends that you continue on your current medications as directed. Please refer to the Current Medication list given to you today.  *If you need a refill on your cardiac medications before your next appointment, please call your pharmacy*   Lab Work: None Ordered If you have labs (blood work) drawn today and your tests are completely normal, you will receive your results only by: MyChart Message (if you have MyChart) OR A paper copy in the mail If you have any lab test that is abnormal or we need to change your treatment, we will call you to review the results.   Testing/Procedures: None Ordered   Follow-Up: At [facility], you and your health needs are our priority.  As part of our continuing mission to provide you with exceptional heart care, we have created designated Provider Care Teams.  These Care Teams include your primary Cardiologist (physician) and Advanced Practice Providers (APPs -  Physician Assistants and Nurse Practitioners) who all work together to provide you with the care you need, when you need it.  We recommend signing up for the patient portal called "MyChart".  Sign up information is provided on this After Visit Summary.  MyChart is used to connect with patients for Virtual Visits (Telemedicine).  Patients are able to view lab/test results, encounter notes, upcoming appointments, etc.  Non-urgent messages can be sent to your provider as well.   To learn more about what you can do with MyChart, go to [email or web address].    Your next appointment:   8 month(s)  The format for your next appointment:   In Person  Provider:   Verne Carrow, MD

## 2020-07-30 NOTE — Progress Notes (Signed)
Chief Complaint  Patient presents with   Follow-up    CAD    History of Present Illness: 60 yo male with history of CAD, PAF, HTN and HLD who is here today for cardiac follow up. He presented in March 2009 with an anterior STEMI. A 2.75 x 28 mm bare metal stent was placed in the proximal LAD. He was readmitted February 2013 with unstable angina. Cardiac cath 03/30/11 with severe stenosis OM1 treated with overlapping drug eluting stents (2.25 x 32 mm Promus Element DES, 2.25 x 16 mm Promus Element DES). There was a 50% diagonal stenosis, 20% RCA stenosis at that time. He was seen in our office November 2020 and reported chest pain. Echo December 2020 with LVEF=60-65%. No valve disease. Exercise stress test was abnormal. Cardiac cath 01/18/19 showed a severe stenosis in the intermediate branch that was treated with a drug eluting stent. Mild disease in the Circumflex. Moderate disease in the RCA that was not hemodynamically significant by DFR. He was seen with palpitations in June 2021. Cardiac monitor June 2021 with SVT and atrial fib. He was started on Xarelto. He was admitted to [facility] 03/17/20 with chest pain. Echo February 2022 with LVEF=60-65%. No significant valve disease. Cardiac cath with patent mid LAD stent, patent intermediate stent, patent mid Circumflex artery stenosis with severe restenosis within the stent. A drug eluting stent was placed in the proximal to mid Circumflex covering the old stent. Moderate mid RCA stenosis that was unchanged from cath in 2020 and not felt to be flow limiting.   He is here today for follow up. The patient denies any chest pain, dyspnea, palpitations, lower extremity edema, orthopnea, PND, dizziness, near syncope or syncope.   Primary Care Physician: Tracey Harries, MD  Past Medical History:  Diagnosis Date   Coronary artery disease    a. 04/2007 - Ant Stemi: LM: nl, LAD: 100p ( 2.75 x 28 Vision BMS ), LCX: 70-75, RI: 40/50, RCA: 31m, EF 35-40%.;  b. 01/2011 -  Abnl ETT (Inf-Lat ST dep);  c.  02/2011 - Ex MV:  Dist ant-sept Infarct.  No ischemia.;  d. 03/30/11 NSTEMI - Cath: 99 LCX into OM1, otw nonobs dzs & NL LV - LCX stented w/ 2 promus DES   Habitual alcohol use    a. at least a 12 pack/wk (03/30/2011)   Hypercholesteremia    Hypertension    PUD (peptic ulcer disease)    in his 45s    Past Surgical History:  Procedure Laterality Date   CARDIAC CATHETERIZATION  01/18/2019   CORONARY STENT INTERVENTION  01/18/2019   CORONARY STENT INTERVENTION N/A 01/18/2019   Procedure: CORONARY STENT INTERVENTION;  Surgeon: Yvonne Kendall, MD;  [facility];  Service: Cardiovascular;  Laterality: N/A;   CORONARY STENT INTERVENTION N/A 03/18/2020   Procedure: CORONARY STENT INTERVENTION;  Surgeon: Kathleene Hazel, MD;  [facility]: [facility];  Service: Cardiovascular;  Laterality: N/A;   INTRAVASCULAR PRESSURE WIRE/FFR STUDY N/A 01/18/2019   Procedure: INTRAVASCULAR PRESSURE WIRE/FFR STUDY;  Surgeon: Yvonne Kendall, MD;  [facility];  Service: Cardiovascular;  Laterality: N/A;   LEFT HEART CATH AND CORONARY ANGIOGRAPHY N/A 01/18/2019   Procedure: LEFT HEART CATH AND CORONARY ANGIOGRAPHY;  Surgeon: Yvonne Kendall, MD;  [facility];  Service: Cardiovascular;  Laterality: N/A;   LEFT HEART CATH AND CORONARY ANGIOGRAPHY N/A 03/18/2020   Procedure: LEFT HEART CATH AND CORONARY ANGIOGRAPHY;  Surgeon: Kathleene Hazel, MD;  [facility];  Service: Cardiovascular;  Laterality: N/A;   LEFT HEART CATHETERIZATION WITH CORONARY ANGIOGRAM N/A 03/30/2011   Procedure: LEFT HEART CATHETERIZATION WITH CORONARY ANGIOGRAM;  Surgeon: Kathleene Hazel, MD;  Location: [facility] CATH LAB;  Service: Cardiovascular;  Laterality: N/A;   None      Current Outpatient Medications  Medication Sig Dispense Refill   amiodarone (PACERONE) 200 MG tablet Take 1 tablet (200 mg total) by mouth daily. 90  tablet 3   atorvastatin (LIPITOR) 80 MG tablet TAKE 1 TABLET BY MOUTH EVERY DAY (Patient taking differently: Take 80 mg by mouth at bedtime.) 90 tablet 2   b complex vitamins tablet Take 1 tablet by mouth daily.      calcium carbonate (TUMS - DOSED IN MG ELEMENTAL CALCIUM) 500 MG chewable tablet Chew 1 tablet by mouth 3 (three) times daily as needed for indigestion or heartburn.     Calcium-Magnesium-Vitamin D (CALCIUM MAGNESIUM PO) Take 1 tablet by mouth daily.     carvedilol (COREG) 6.25 MG tablet TAKE 1 TABLET (6.25 MG TOTAL) BY MOUTH 2 (TWO) TIMES DAILY WITH A MEAL. 180 tablet 3   cetirizine (ZYRTEC) 10 MG tablet Take 10 mg by mouth daily.     clopidogrel (PLAVIX) 75 MG tablet TAKE 1 TABLET BY MOUTH EVERY DAY 90 tablet 1   Coenzyme Q10 (COQ-10) 100 MG CAPS Take 100 mg by mouth daily.     ezetimibe (ZETIA) 10 MG tablet TAKE 1 TABLET BY MOUTH EVERY DAY 90 tablet 3   famotidine (PEPCID) 20 MG tablet Take 20 mg by mouth daily.     FLAXSEED-EVE PRIM-BORAGE PO Take 1 capsule by mouth daily.      Multiple Vitamin (MULTIVITAMIN) tablet Take 1 tablet by mouth daily.     nitroGLYCERIN (NITROSTAT) 0.4 MG SL tablet PLACE 1 TABLET UNDER THE TONGUE EVERY 5 (FIVE) MINUTES AS NEEDED FOR CHEST PAIN. 25 tablet 3   XARELTO 20 MG TABS tablet TAKE 1 TABLET (20 MG TOTAL) BY MOUTH DAILY WITH SUPPER. 30 tablet 6   zinc gluconate 50 MG tablet Take 50 mg by mouth daily.     No current facility-administered medications for this visit.    No Known Allergies  Social History   Socioeconomic History   Marital status: Single    Spouse name: Not on file   Number of children: 0   Years of education: Not on file   Highest education level: Not on file  Occupational History   Occupation: Photographer CSR    Employer: [employer]  Tobacco Use   Smoking status: Never   Smokeless tobacco: Never  Vaping Use   Vaping Use: Never used  Substance and Sexual Activity   Alcohol use: Yes    Alcohol/week: 10.0  standard drinks    Types: 10 Standard drinks or equivalent per week    Comment: 2 six packs a week, mostly on the weekends   Drug use: No   Sexual activity: Not on file  Other Topics Concern   Not on file  Social History Narrative   Lives in [place] by himself.  Works locally as a Stage manager.   Social Determinants of Health   Financial Resource Strain: Not on file  Food Insecurity: Not on file  Transportation Needs: Not on file  Physical Activity: Not on file  Stress: Not on file  Social Connections: Not on file  Intimate Partner Violence: Not on file    Family History  Problem  Relation Age of Onset   Coronary artery disease Mother 41       alive @ 82   Coronary artery disease Father        had MI in his 89's - died @ 43   Heart attack Brother 70       alive - 11    Review of Systems:  As stated in the HPI and otherwise negative.   BP 102/66   Pulse 65   Ht 5\' 10"  (1.778 m)   Wt 229 lb (103.9 kg)   SpO2 100%   BMI 32.86 kg/m   Physical Examination: General: Well developed, well nourished, NAD  HEENT: OP clear, mucus membranes moist  SKIN: warm, dry. No rashes. Neuro: No focal deficits  Musculoskeletal: Muscle strength 5/5 all ext  Psychiatric: Mood and affect normal  Neck: No JVD, no carotid bruits, no thyromegaly, no lymphadenopathy.  Lungs:Clear bilaterally, no wheezes, rhonci, crackles Cardiovascular: Regular rate and rhythm. No murmurs, gallops or rubs. Abdomen:Soft. Bowel sounds present. Non-tender.  Extremities: No lower extremity edema. Pulses are 2 + in the bilateral DP/PT.  EKG:  EKG is not ordered today. The ekg ordered today demonstrates   Echo 03/18/20:  1. Left ventricular ejection fraction, by estimation, is 60 to 65%. The  left ventricle has normal function. The left ventricle has no regional  wall motion abnormalities. Left ventricular diastolic parameters were  normal.   2. Right ventricular systolic function is normal. The  right ventricular  size is normal. Tricuspid regurgitation signal is inadequate for assessing  PA pressure.   3. The mitral valve is normal in structure. Trivial mitral valve  regurgitation.   4. The aortic valve is tricuspid. Aortic valve regurgitation is not  visualized. No aortic stenosis is present.   5. The inferior vena cava is normal in size with greater than 50%  respiratory variability, suggesting right atrial pressure of 3 mmHg.   Recent Labs: 03/17/2020: Magnesium 2.1 03/19/2020: BUN 10; Creatinine, Ser 0.91; Hemoglobin 13.7; Platelets 146; Potassium 4.2; Sodium 137   Lipid Panel Followed in primary care   Wt Readings from Last 3 Encounters:  07/30/20 229 lb (103.9 kg)  04/02/20 223 lb 6.4 oz (101.3 kg)  03/18/20 216 lb 4.3 oz (98.1 kg)     Other studies Reviewed: Additional studies/ records that were reviewed today include: . Review of the above records demonstrates:    Assessment and Plan:   1. CAD without angina:  No chest pain since he had his PCI in February 2022. Will continue Plavix, statin and beta blocker.    2. HTN: BP is controlled. No change in therapy  3. HLD: Lipids are followed in primary care. LDL at goal in February 2022. Continue statin and Zetia.    4. Paroxysmal atrial fibrillation. Sinus on exam today. Continue amiodarone, beta blocker and Xarelto.   Current medicines are reviewed at length with the patient today.  The patient does not have concerns regarding medicines.  The following changes have been made:  no change  Labs/ tests ordered today include:   No orders of the defined types were placed in this encounter.   Disposition:   F/U with me in 12  months  Signed, March 2022, MD 07/30/2020 4:35 PM    [facility] [address] [place], [place], [place]  [place] Phone: [phone]; Fax: [phone]

## 2020-09-07 ENCOUNTER — Other Ambulatory Visit: Payer: Self-pay | Admitting: Cardiovascular Disease

## 2020-09-09 ENCOUNTER — Other Ambulatory Visit: Payer: Self-pay | Admitting: Cardiovascular Disease

## 2020-12-07 ENCOUNTER — Other Ambulatory Visit: Payer: Self-pay

## 2020-12-07 DIAGNOSIS — I480 Paroxysmal atrial fibrillation: Secondary | ICD-10-CM

## 2020-12-07 MED ORDER — RIVAROXABAN 20 MG PO TABS: 20.0000 mg | ORAL_TABLET | Freq: Every day | ORAL | 6 refills | Status: DC

## 2020-12-07 NOTE — Telephone Encounter (Signed)
Prescription refill request for Xarelto received.  Indication: Afib  Last office visit: 07/30/20 [name])  Weight: 103.9kg Age: 60 Scr: 0.91 (03/19/20) CrCl: 126.23ml/min  Appropriate dose and refill sent to requested pharmacy.

## 2021-01-03 ENCOUNTER — Other Ambulatory Visit: Payer: Self-pay | Admitting: Cardiovascular Disease

## 2021-03-09 ENCOUNTER — Other Ambulatory Visit: Payer: Self-pay | Admitting: Cardiovascular Disease

## 2021-03-25 ENCOUNTER — Other Ambulatory Visit: Payer: Self-pay | Admitting: *Deleted

## 2021-03-25 MED ORDER — CLOPIDOGREL BISULFATE 75 MG PO TABS: 75.0000 mg | ORAL_TABLET | Freq: Every day | ORAL | 11 refills | Status: DC

## 2021-03-30 ENCOUNTER — Ambulatory Visit
Admission: RE | Admit: 2021-03-30 | Discharge: 2021-03-30 | Disposition: A | Discharge status: Home / Self Care | Payer: 59 | Source: Ambulatory Visit | Attending: Cardiovascular Disease | Admitting: Cardiovascular Disease

## 2021-03-30 ENCOUNTER — Encounter: Payer: Self-pay | Admitting: Cardiovascular Disease

## 2021-03-30 ENCOUNTER — Other Ambulatory Visit: Payer: Self-pay

## 2021-03-30 ENCOUNTER — Ambulatory Visit: Payer: 59 | Admitting: Cardiovascular Disease

## 2021-03-30 VITALS — BP 110/64 | HR 63 | Ht 70.0 in | Wt 230.2 lb

## 2021-03-30 DIAGNOSIS — E7800 Pure hypercholesterolemia, unspecified: Secondary | ICD-10-CM | POA: Diagnosis not present

## 2021-03-30 DIAGNOSIS — I480 Paroxysmal atrial fibrillation: Secondary | ICD-10-CM | POA: Diagnosis not present

## 2021-03-30 DIAGNOSIS — I10 Essential (primary) hypertension: Secondary | ICD-10-CM

## 2021-03-30 DIAGNOSIS — I2510 Atherosclerotic heart disease of native coronary artery without angina pectoris: Secondary | ICD-10-CM | POA: Diagnosis not present

## 2021-03-30 NOTE — Patient Instructions (Signed)
Medication Instructions:  No changes *If you need a refill on your cardiac medications before your next appointment, please call your pharmacy*   Lab Work: Today: TSH  If you have labs (blood work) drawn today and your tests are completely normal, you will receive your results only by: MyChart Message (if you have MyChart) OR A paper copy in the mail If you have any lab test that is abnormal or we need to change your treatment, we will call you to review the results.   Testing/Procedures: A chest x-ray takes a picture of the organs and structures inside the chest, including the heart, lungs, and blood vessels. This test can show several things, including, whether the heart is enlarges; whether fluid is building up in the lungs; and whether pacemaker / defibrillator leads are still in place.   Follow-Up: At [facility], you and your health needs are our priority.  As part of our continuing mission to provide you with exceptional heart care, we have created designated Provider Care Teams.  These Care Teams include your primary Cardiologist (physician) and Advanced Practice Providers (APPs -  Physician Assistants and Nurse Practitioners) who all work together to provide you with the care you need, when you need it.  We recommend signing up for the patient portal called "MyChart".  Sign up information is provided on this After Visit Summary.  MyChart is used to connect with patients for Virtual Visits (Telemedicine).  Patients are able to view lab/test results, encounter notes, upcoming appointments, etc.  Non-urgent messages can be sent to your provider as well.   To learn more about what you can do with MyChart, go to [email or web address].    Your next appointment:   12 month(s)  The format for your next appointment:   In Person  Provider:   Verne Carrow, MD     Other Instructions

## 2021-03-30 NOTE — Progress Notes (Signed)
Chief Complaint  Patient presents with   Follow-up    CAD   History of Present Illness: 61 yo male with history of CAD, PAF, HTN and HLD who is here today for cardiac follow up. He presented in March 2009 with an anterior STEMI. A 2.75 x 28 mm bare metal stent was placed in the proximal LAD. He was readmitted February 2013 with unstable angina. Cardiac cath 03/30/11 with severe stenosis OM1 treated with overlapping drug eluting stents (2.25 x 32 mm Promus Element DES, 2.25 x 16 mm Promus Element DES). There was a 50% diagonal stenosis, 20% RCA stenosis at that time. He was seen in our office November 2020 and reported chest pain. Echo December 2020 with LVEF=60-65%. No valve disease. Exercise stress test was abnormal. Cardiac cath 01/18/19 showed a severe stenosis in the intermediate branch that was treated with a drug eluting stent. Mild disease in the Circumflex. Moderate disease in the RCA that was not hemodynamically significant by DFR. He was seen with palpitations in June 2021. Cardiac monitor June 2021 with SVT and atrial fib. He was started on Xarelto. He was [facility] to [facility]/17/20 with chest pain. Echo February 2022 with LVEF=60-65%. No significant valve disease. Cardiac cath February 2022 with patent mid LAD stent, patent intermediate stent, patent mid Circumflex artery stenosis with severe restenosis within the stent. A drug eluting stent was placed in the proximal to mid Circumflex covering the old stent. Moderate mid RCA stenosis that was unchanged from cath in 2020 and not felt to be flow limiting.   She is here today for follow up. The patient denies any chest pain, dyspnea, palpitations, lower extremity edema, orthopnea, PND, dizziness, near syncope or syncope.   Primary Care Physician: Tracey Harries, MD  Past Medical History:  Diagnosis Date   Coronary artery disease    a. 04/2007 - Ant Stemi: LM: nl, LAD: 100p ( 2.75 x 28 Vision BMS ), LCX: 70-75, RI: 40/50, RCA: 47m, EF 35-40%.;   b. 01/2011 - Abnl ETT (Inf-Lat ST dep);  c.  02/2011 - Ex MV:  Dist ant-sept Infarct.  No ischemia.;  d. 03/30/11 NSTEMI - Cath: 99 LCX into OM1, otw nonobs dzs & NL LV - LCX stented w/ 2 promus DES   Habitual alcohol use    a. at least a 12 pack/wk (03/30/2011)   Hypercholesteremia    Hypertension    PUD (peptic ulcer disease)    in his 54s    Past Surgical History:  Procedure Laterality Date   CARDIAC CATHETERIZATION  01/18/2019   CORONARY STENT INTERVENTION  01/18/2019   CORONARY STENT INTERVENTION N/A 01/18/2019   Procedure: CORONARY STENT INTERVENTION;  Surgeon: Yvonne Kendall, MD;  [facility];  Service: Cardiovascular;  Laterality: N/A;   CORONARY STENT INTERVENTION N/A 03/18/2020   Procedure: CORONARY STENT INTERVENTION;  Surgeon: Kathleene Hazel, MD;  [facility];  Service: Cardiovascular;  Laterality: N/A;   INTRAVASCULAR PRESSURE WIRE/FFR STUDY N/A 01/18/2019   Procedure: INTRAVASCULAR PRESSURE WIRE/FFR STUDY;  Surgeon: Yvonne Kendall, MD;  [facility];  Service: Cardiovascular;  Laterality: N/A;   LEFT HEART CATH AND CORONARY ANGIOGRAPHY N/A 01/18/2019   Procedure: LEFT HEART CATH AND CORONARY ANGIOGRAPHY;  Surgeon: Yvonne Kendall, MD;  [facility];  Service: Cardiovascular;  Laterality: N/A;   LEFT HEART CATH AND CORONARY ANGIOGRAPHY N/A 03/18/2020   Procedure: LEFT HEART CATH AND CORONARY ANGIOGRAPHY;  Surgeon: Kathleene Hazel, MD;  [facility];  Service: Cardiovascular;  Laterality: N/A;   LEFT HEART CATHETERIZATION WITH CORONARY ANGIOGRAM N/A 03/30/2011   Procedure: LEFT HEART CATHETERIZATION WITH CORONARY ANGIOGRAM;  Surgeon: Kathleene Hazelhristopher D Caldonia Leap, MD;  [facility];  Service: Cardiovascular;  Laterality: N/A;   None      Current Outpatient Medications  Medication Sig Dispense Refill   amiodarone (PACERONE) 200 MG tablet Take 1 tablet (200 mg total) by mouth  daily. 90 tablet 3   atorvastatin (LIPITOR) 80 MG tablet Take 1 tablet (80 mg total) by mouth at bedtime. 90 tablet 3   b complex vitamins tablet Take 1 tablet by mouth daily.      calcium carbonate (TUMS - DOSED IN MG ELEMENTAL CALCIUM) 500 MG chewable tablet Chew 1 tablet by mouth 3 (three) times daily as needed for indigestion or heartburn.     Calcium-Magnesium-Vitamin D (CALCIUM MAGNESIUM PO) Take 1 tablet by mouth daily.     carvedilol (COREG) 6.25 MG tablet TAKE 1 TABLET BY MOUTH 2 TIMES DAILY WITH A MEAL. 180 tablet 1   cetirizine (ZYRTEC) 10 MG tablet Take 10 mg by mouth daily.     clopidogrel (PLAVIX) 75 MG tablet Take 1 tablet (75 mg total) by mouth daily. 30 tablet 11   Coenzyme Q10 (COQ-10) 100 MG CAPS Take 100 mg by mouth daily.     ezetimibe (ZETIA) 10 MG tablet TAKE 1 TABLET BY MOUTH EVERY DAY 90 tablet 3   famotidine (PEPCID) 20 MG tablet Take 20 mg by mouth daily.     FLAXSEED-EVE PRIM-BORAGE PO Take 1 capsule by mouth daily.      Multiple Vitamin (MULTIVITAMIN) tablet Take 1 tablet by mouth daily.     nitroGLYCERIN (NITROSTAT) 0.4 MG SL tablet PLACE AND DISSOLVE 1 TABLET UNDER THE TONGUE EVERY 5 MINUTES AS NEEDED FOR CHEST PAIN 25 tablet 5   rivaroxaban (XARELTO) 20 MG TABS tablet Take 1 tablet (20 mg total) by mouth daily with supper. 30 tablet 6   zinc gluconate 50 MG tablet Take 50 mg by mouth daily.     No current facility-administered medications for this visit.    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   Social Determinants of Health   Financial Resource Strain: Not on file  Food Insecurity: Not on file  Transportation Needs: Not on file  Physical Activity: Not on file  Stress: Not on file  Social Connections: Not on file  Intimate Partner Violence: Not on file    Family History  Problem Relation Age of Onset   Coronary  artery disease Mother 74       alive @ 48   Coronary artery disease Father        had MI in his 56's - died @ 33   Heart attack Brother 19       alive - 36    Review of Systems:  As stated in the HPI and otherwise negative.   BP 110/64    Pulse 63    Ht 5\' 10"  (1.778 m)    Wt 230 lb 3.7 oz (104.4 kg)    SpO2 98%    BMI 33.03 kg/m   Physical Examination: General: Well developed, well nourished, NAD  HEENT: OP clear, mucus membranes moist  SKIN: warm, dry. No rashes. Neuro: No focal deficits  Musculoskeletal: Muscle strength 5/5 all ext  Psychiatric: Mood and affect normal  Neck: No JVD, no carotid bruits, no thyromegaly, no lymphadenopathy.  Lungs:Clear bilaterally, no wheezes, rhonci, crackles Cardiovascular: Regular rate and rhythm. No murmurs, gallops or rubs. Abdomen:Soft. Bowel sounds present. Non-tender.  Extremities: No lower extremity edema. Pulses are 2 + in the bilateral DP/PT.  EKG:  EKG is ordered today. The ekg ordered today demonstrates sinus  Echo 03/18/20:  1. Left ventricular ejection fraction, by estimation, is 60 to 65%. The  left ventricle has normal function. The left ventricle has no regional  wall motion abnormalities. Left ventricular diastolic parameters were  normal.   2. Right ventricular systolic function is normal. The right ventricular  size  is normal. Tricuspid regurgitation signal is inadequate for assessing  PA pressure.   3. The mitral valve is normal in structure. Trivial mitral valve  regurgitation.   4. The aortic valve is tricuspid. Aortic valve regurgitation is not  visualized. No aortic stenosis is present.   5. The inferior vena cava is normal in size with greater than 50%  respiratory variability, suggesting right atrial pressure of 3 mmHg.   Recent Labs: No results found for requested labs within last 8760 hours.   Lipid Panel Followed in primary care   Wt Readings from Last 3 Encounters:  03/30/21 230 lb 3.7 oz (104.4 kg)  07/30/20 229 lb (103.9 kg)  04/02/20 223 lb 6.4 oz (101.3 kg)     Other studies Reviewed: Additional studies/ records that were reviewed today include: . Review of the above records demonstrates:    Assessment and Plan:   1. CAD without angina:  He has no chest pain. Continue Plavix, beta blocker and statin.   No ASA since he is on Xarelto.   2. HTN: BP is well controlled. No changes today  3. HLD: Lipids are followed in primary care. LDL well controlled in October 2022 (see Care Everywhere)  Continue statin and Zetia.   4. Paroxysmal atrial fibrillation. Sinus today. Will continue beta blocker, amiodarone and Xarelto.  Given maintenance on amiodarone will arrange chest x-ray, TSH. He had normal  LFTs in October 2022. He will set up an eye exam. QT interval is ok on today's EKG.   Current medicines are reviewed at length with the patient today.  The patient does not have concerns regarding medicines.  The following changes have been made:  no change  Labs/ tests ordered today include:   Orders Placed This Encounter  Procedures   DG Chest 2 View   TSH   EKG 12-Lead     Disposition:   F/U with me in 12  months  Signed, November 2022, MD 03/30/2021 3:22 PM  [facility] [address] [place], [place], [place]  [address] Phone: [phone]; Fax:  [phone]

## 2021-03-31 LAB — TSH: TSH: 3.29 u[IU]/mL (ref 0.450–4.500)

## 2021-05-28 ENCOUNTER — Other Ambulatory Visit: Payer: Self-pay | Admitting: Cardiovascular Disease

## 2021-05-28 DIAGNOSIS — I480 Paroxysmal atrial fibrillation: Secondary | ICD-10-CM

## 2021-05-28 NOTE — Telephone Encounter (Signed)
Prescription refill request for Xarelto received.  ?Indication: PAF ?Last office visit: 03/30/21  Alyson Ingles MD ?Weight: 104.4kg ?Age: 61 ?Scr: 1.21 on 11/16/20 ?CrCl: 95.87 ? ?Based on above findings Xarelto 20mg  daily is the appropriate dose.  Refill approved. ? ?

## 2021-06-11 IMAGING — CR DG CHEST 2V
2 series · 2 of 2 positions shown · non-contrast
Comparison: Remote radiograph 04/19/2007

CLINICAL DATA: Left-sided chest pain.  Shortness of breath.

EXAM:
CHEST - 2 VIEW

[chest pa]
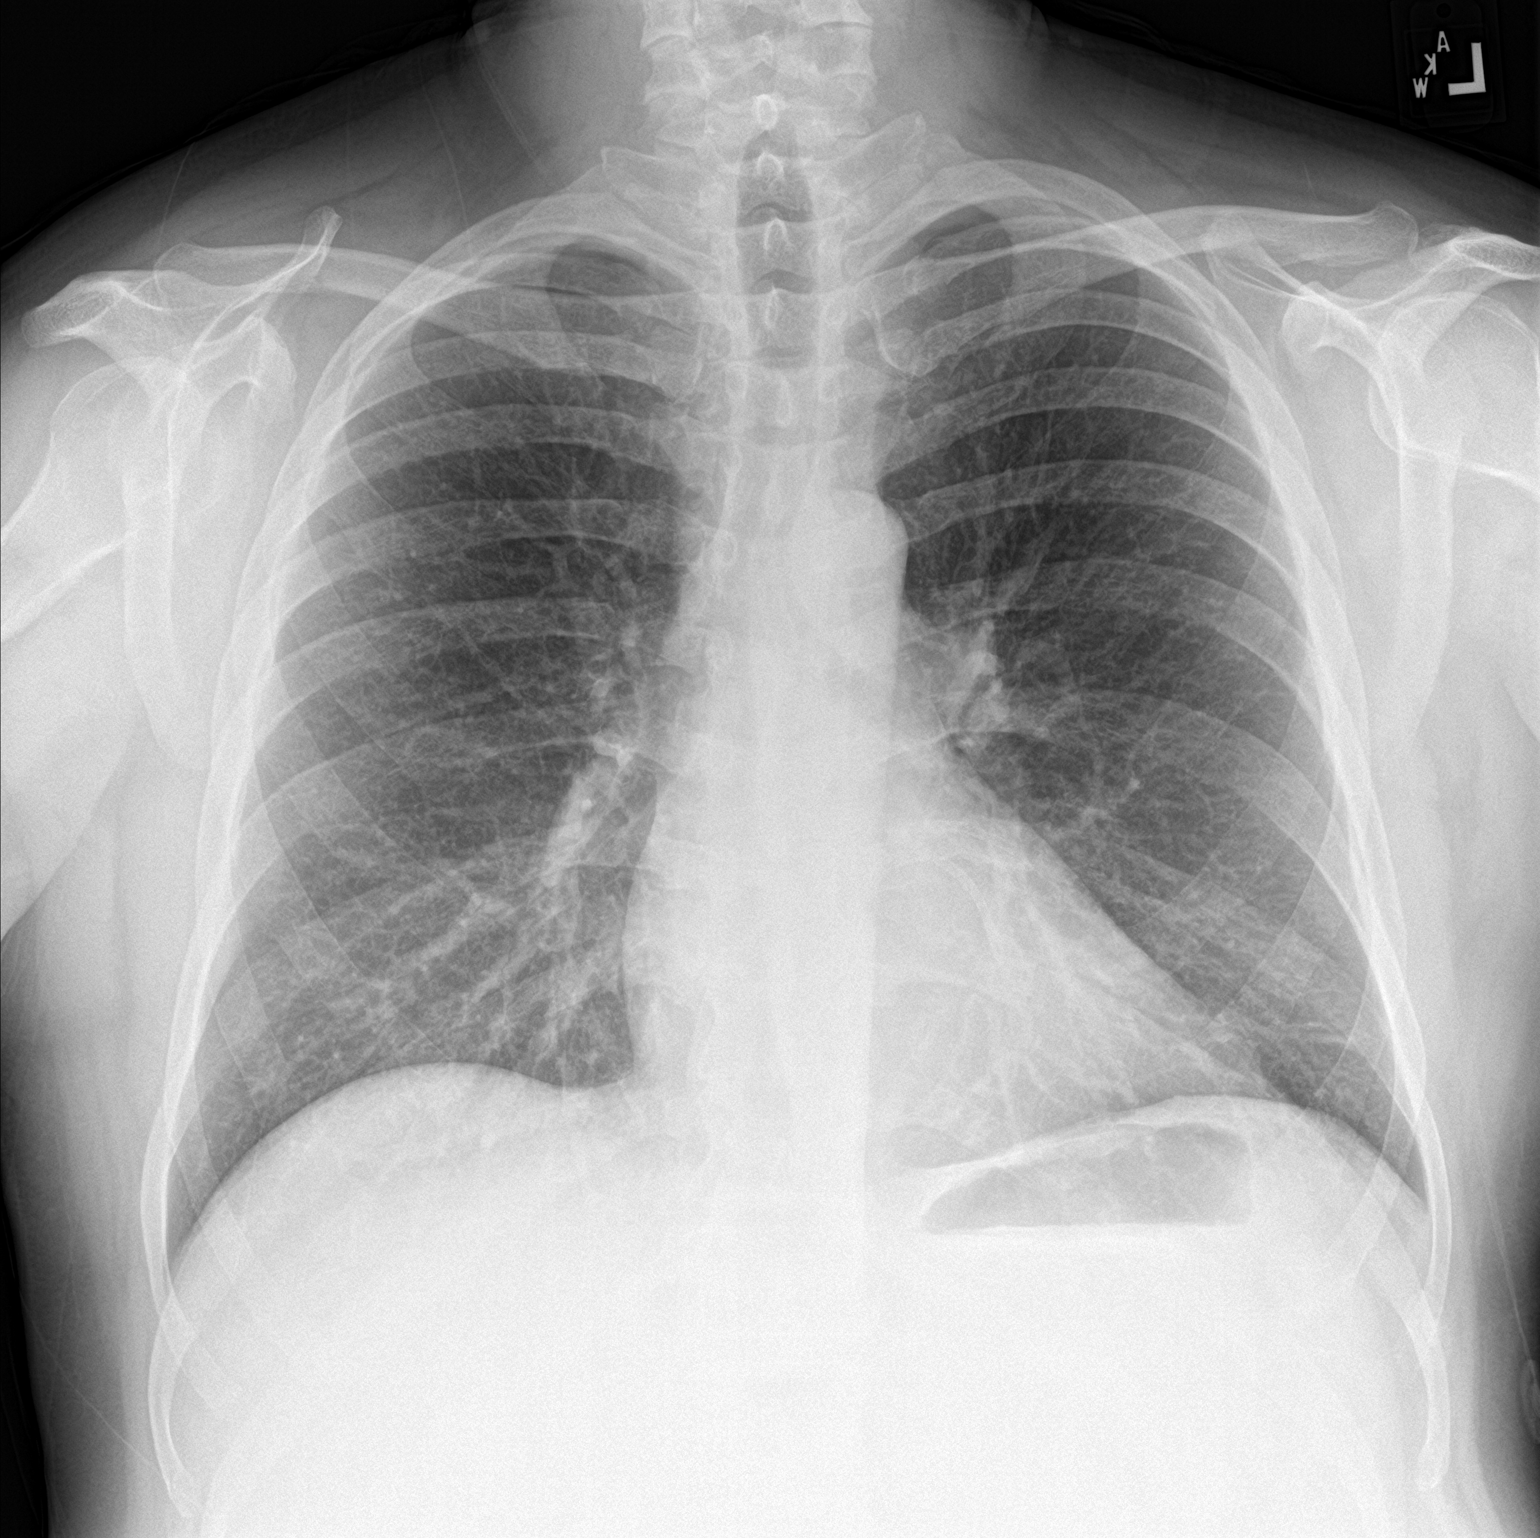

[chest lat]
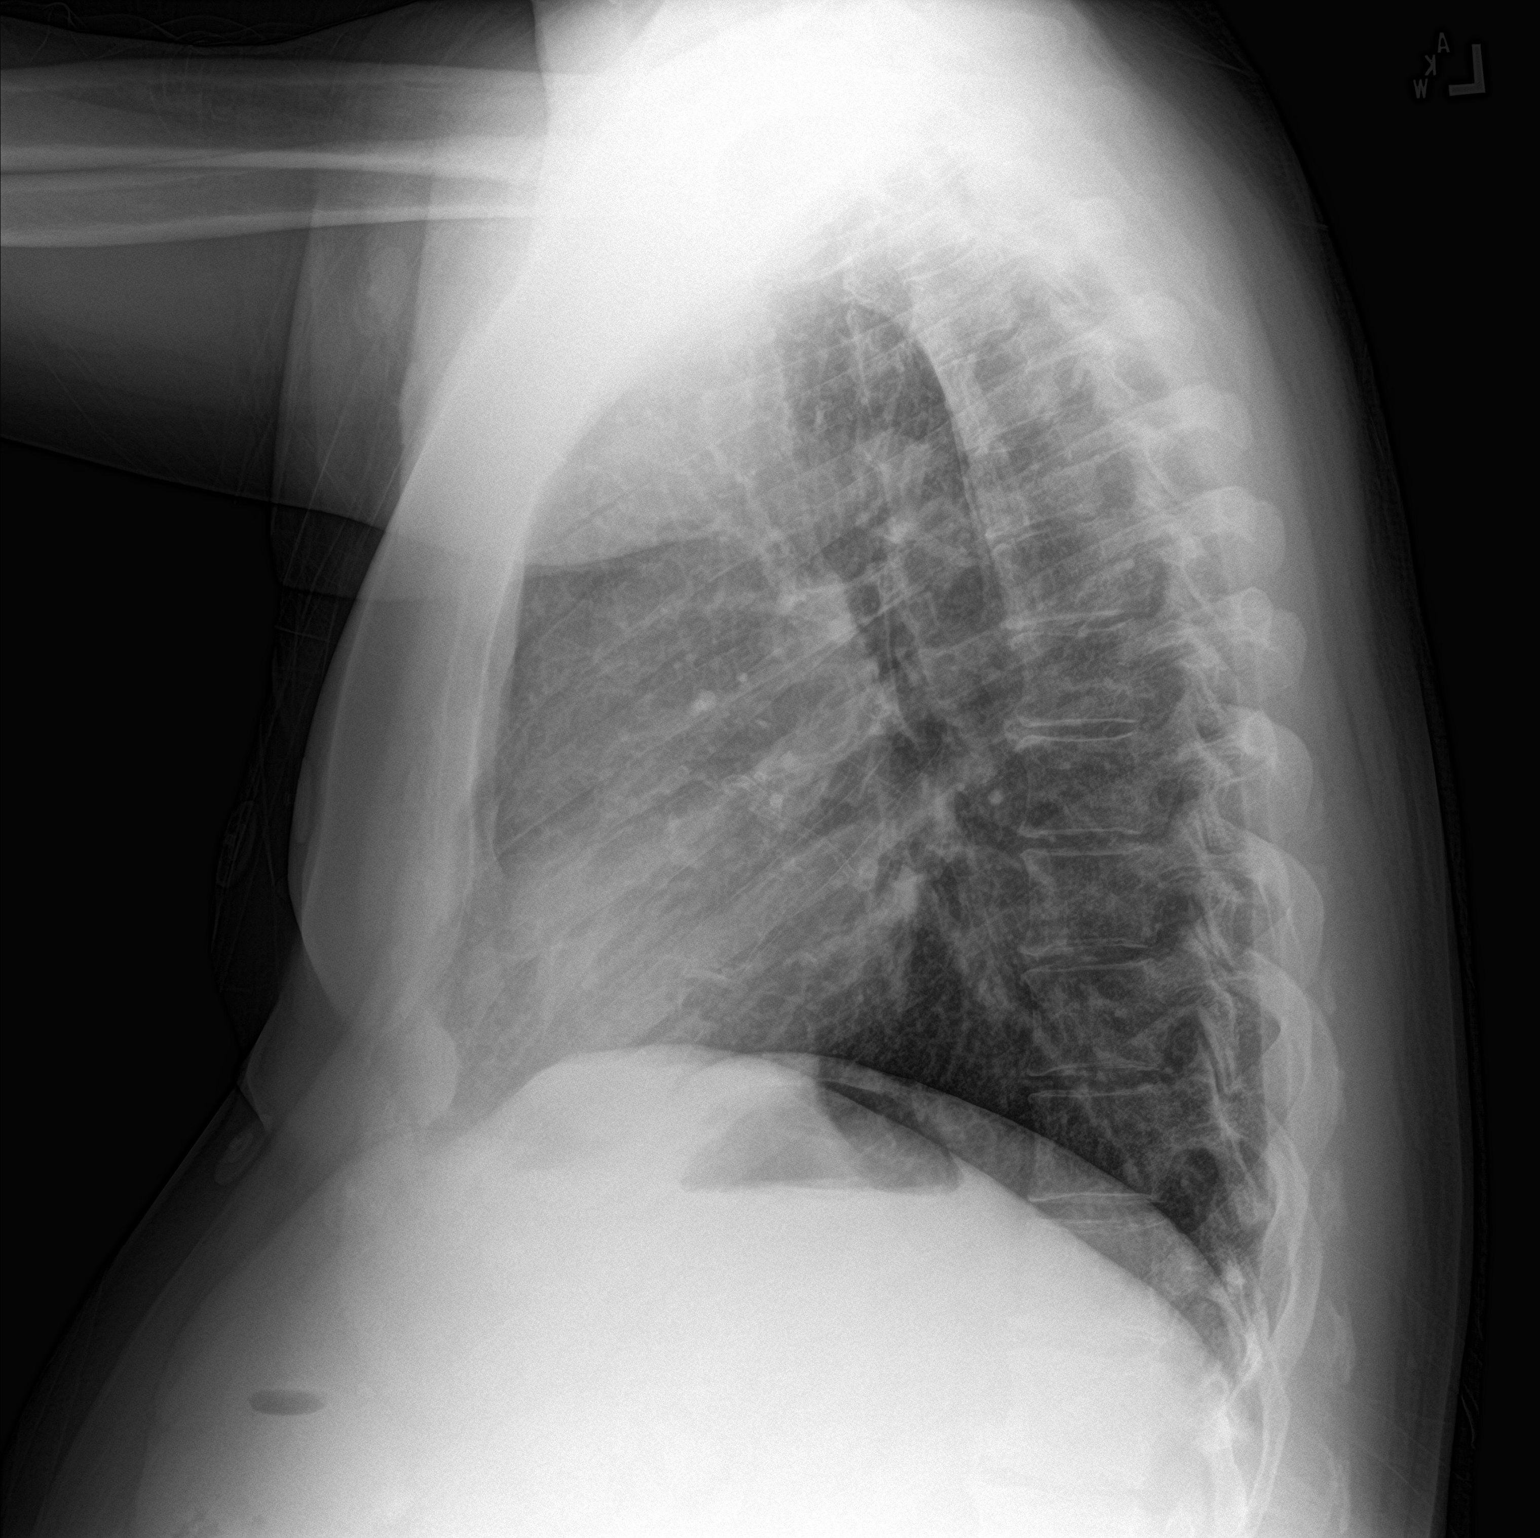

[2 of 2 positions shown; findings below may reference images not displayed]

FINDINGS: The cardiomediastinal contours are normal. Coronary stents
visualized. The lungs are clear. Pulmonary vasculature is normal. No
consolidation, pleural effusion, or pneumothorax. No acute osseous
abnormalities are seen.
IMPRESSION: No acute chest findings.

## 2021-09-23 ENCOUNTER — Other Ambulatory Visit: Payer: Self-pay

## 2021-09-23 MED ORDER — CARVEDILOL 6.25 MG PO TABS: 6.2500 mg | ORAL_TABLET | Freq: Two times a day (BID) | ORAL | 1 refills | Status: DC

## 2021-09-23 MED ORDER — EZETIMIBE 10 MG PO TABS: 10.0000 mg | ORAL_TABLET | Freq: Every day | ORAL | 1 refills | Status: DC

## 2021-11-05 ENCOUNTER — Other Ambulatory Visit: Payer: Self-pay

## 2021-11-05 MED ORDER — AMIODARONE HCL 200 MG PO TABS: 200.0000 mg | ORAL_TABLET | Freq: Every day | ORAL | 1 refills | Status: DC

## 2021-11-05 MED ORDER — ATORVASTATIN CALCIUM 80 MG PO TABS: 80.0000 mg | ORAL_TABLET | Freq: Every day | ORAL | 1 refills | Status: DC

## 2021-11-05 NOTE — Addendum Note (Signed)
Addended by: Carter Kitten D on: 11/05/2021 08:47 AM   Modules accepted: Orders

## 2022-03-15 ENCOUNTER — Other Ambulatory Visit: Payer: Self-pay | Admitting: Cardiovascular Disease

## 2022-03-28 ENCOUNTER — Other Ambulatory Visit: Payer: Self-pay

## 2022-03-28 MED ORDER — CLOPIDOGREL BISULFATE 75 MG PO TABS: 75.0000 mg | ORAL_TABLET | Freq: Every day | ORAL | 0 refills | Status: DC

## 2022-03-28 NOTE — Progress Notes (Unsigned)
Cardiology Clinic Note   Patient Name: [name] Date of Encounter: 03/30/2022  Primary Care Provider:  Bernerd Limbo, MD Primary Cardiologist:  Lauree Chandler, MD  Patient Profile    [name] is a 62 y.o. male with a past medical history of CAD, PAF on anticoagulation, hypertension, hyperlipidemia who presents to the clinic today for 1 year follow-up.   Past Medical History    Past Medical History:  Diagnosis Date   Coronary artery disease    a. 04/2007 - Ant Stemi: LM: nl, LAD: 100p ( 2.75 x 28 Vision BMS ), LCX: 70-75, RI: 40/50, RCA: 75m EF 35-40%.;  b. 01/2011 - Abnl ETT (Inf-Lat ST dep);  c.  02/2011 - Ex MV:  Dist ant-sept Infarct.  No ischemia.;  d. 03/30/11 NSTEMI - Cath: 99 LCX into OM1, otw nonobs dzs & NL LV - LCX stented w/ 2 promus DES   Habitual alcohol use    a. at least a 12 pack/wk (03/30/2011)   Hypercholesteremia    Hypertension    PUD (peptic ulcer disease)    in his 2109s  Past Surgical History:  Procedure Laterality Date   CARDIAC CATHETERIZATION  01/18/2019   CORONARY STENT INTERVENTION  01/18/2019   CORONARY STENT INTERVENTION N/A 01/18/2019   Procedure: CORONARY STENT INTERVENTION;  Surgeon: ENelva Bush MD;  [facility];  Service: Cardiovascular;  Laterality: N/A;   CORONARY STENT INTERVENTION N/A 03/18/2020   Procedure: CORONARY STENT INTERVENTION;  Surgeon: MBurnell Blanks MD;  [facility];  Service: Cardiovascular;  Laterality: N/A;   INTRAVASCULAR PRESSURE WIRE/FFR STUDY N/A 01/18/2019   Procedure: INTRAVASCULAR PRESSURE WIRE/FFR STUDY;  Surgeon: ENelva Bush MD;  [facility];  Service: Cardiovascular;  Laterality: N/A;   LEFT HEART CATH AND CORONARY ANGIOGRAPHY N/A 01/18/2019   Procedure: LEFT HEART CATH AND CORONARY ANGIOGRAPHY;  Surgeon: ENelva Bush MD;  [facility];  Service: Cardiovascular;  Laterality: N/A;   LEFT HEART CATH AND CORONARY  ANGIOGRAPHY N/A 03/18/2020   Procedure: LEFT HEART CATH AND CORONARY ANGIOGRAPHY;  Surgeon: MBurnell Blanks MD;  [facility];  Service: Cardiovascular;  Laterality: N/A;   LEFT HEART CATHETERIZATION WITH CORONARY ANGIOGRAM N/A 03/30/2011   Procedure: LEFT HEART CATHETERIZATION WITH CORONARY ANGIOGRAM;  Surgeon: CBurnell Blanks MD;  [facility];  Service: Cardiovascular;  Laterality: N/A;   None      Allergies  No Known Allergies  History of Present Illness    TTaivenA Maynard has a past medical history of: CAD. LHC 04/19/2007 (STEMI): LCx 70-75%, RI 40/50%, mid RCA 30%. PCI with stent to mid LAD.  LHC 03/30/2011: Patent mid LAD stent. Proximal Cx 40%. Mid Cx into OM1 99%. Mid RCA 25%. PCI with DES x 2 overlapping mid Cx/OM1.  LHC 01/18/19 (positive stress test): Ramus intermedius 70-80%, proximal LCx 40%, mid RCA 60% (not hemodynamically significant DFR). Patent LAD and LCx stents. PCI with DES 2.25 x 24 mm to ramus intermedius.  LHC 03/18/2020 (NSTEMI): D1 30%, proximal to mid LD 30%, mid RCA 60%, ostial to proximal LCx 60%, proximal to mid LCx 99%. Patent LAD stent with minimal restenosis. Patent stent ramus. PCI with DES 2.75 x 38 mm to proximal to mid Cx.  Echo 03/18/2020: EF 60-65%. Trivial MR.  PAF. Event monitor 07/12/2019: Sinus rhythm. Afib vs atrial tachycardia. PACs. PVCs.  Afib with RVR during hospital admission February 2022. Converted on  IV amiodarone. Transitioned to PO amiodarone prior to discharge.  Hypertension.  Hyperlipidemia.   [name] is a long-time patient of cardiology initially followed by Dr. Lia Foyer now followed by Dr. Angelena Form for the above outlined history.  Patient was last seen in the office by Dr. Angelena Form on 03/30/2021.  At that time he was doing well and no changes were made to his medications.  Today, patient reports he is doing well. He does have occasional anginal pain that occurs as aching discomfort in bilateral  upper chest and back or arms. About 2 months ago he experienced the same mild anginal pain about every other day for 2-3 weeks. It would travel to different spots throughout the day lasting for 10-30 minutes and resolving on its own. He thinks he tried NTG one time but he did not think it helped so he did not try it again. Pain typically came on during rest and did not worsen with exertion. The pain did not limit his activity and did not come on with walking. He noticed that he had run out of his calcium and magnesium and as soon as he restarted it the pain resolved completely and went back to the occasional brief episodes he has had in the past. Discussed nuclear stress testing vs Ranexa vs watchful monitoring. He is not interested in changing or starting a new medication. He would like to hold off on stress testing for now. Patient reports occasional palpitations stating "my heart does something weird for a second and I figure I am in afib but then it goes away so I don't worry about it." He reports this episodes are sporadic and do not last longer than seconds to minutes. He denies any spontaneous bleeding. He has not had his eyes checked in about 2 years. Discussed the importance of regular eye exams. He states he is going to get the name of the eye doctor he saw last and schedule an appointment for as soon as possible. He admits to dietary indiscretion and decreased activity. He has a treadmill at home and uses it inconsistently. 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 He has taken on more administrative responsibilities and admits to feeling a bit more stressed.     Home Medications    Current Meds  Medication Sig   amiodarone (PACERONE) 200 MG tablet Take 1 tablet (200 mg total) by mouth daily.   atorvastatin (LIPITOR) 80 MG tablet Take 1 tablet (80 mg total) by mouth at bedtime.   b complex vitamins tablet Take 1 tablet by mouth daily.    calcium carbonate  (TUMS - DOSED IN MG ELEMENTAL CALCIUM) 500 MG chewable tablet Chew 1 tablet by mouth 3 (three) times daily as needed for indigestion or heartburn.   Calcium-Magnesium-Vitamin D (CALCIUM MAGNESIUM PO) Take 1 tablet by mouth daily.   carvedilol (COREG) 6.25 MG tablet Take 1 tablet (6.25 mg total) by mouth 2 (two) times daily with a meal.   cetirizine (ZYRTEC) 10 MG tablet Take 10 mg by mouth daily.   clopidogrel (PLAVIX) 75 MG tablet Take 1 tablet (75 mg total) by mouth daily. Please keep your upcoming appointment for any future refills. Thank you.   Coenzyme Q10 (COQ-10) 100 MG CAPS Take 100 mg by mouth daily.   ezetimibe (ZETIA) 10 MG tablet TAKE 1 TABLET BY MOUTH EVERY DAY   famotidine (PEPCID) 20 MG tablet Take 20 mg by mouth daily.   FLAXSEED-EVE PRIM-BORAGE PO Take  1 capsule by mouth daily.    Multiple Vitamin (MULTIVITAMIN) tablet Take 1 tablet by mouth daily.   nitroGLYCERIN (NITROSTAT) 0.4 MG SL tablet PLACE AND DISSOLVE 1 TABLET UNDER THE TONGUE EVERY 5 MINUTES AS NEEDED FOR CHEST PAIN   vitamin E 45 MG (100 UNITS) capsule Patient takes 1 gel capsule by mouth once daily   XARELTO 20 MG TABS tablet TAKE 1 TABLET(20 MG) BY MOUTH DAILY WITH SUPPER   zinc gluconate 50 MG tablet Take 50 mg by mouth daily.    Family History    Family History  Problem Relation Age of Onset   Coronary artery disease Mother 60       alive @ 59   Coronary artery disease Father        had MI in his 29's - died @ 83   Heart attack Brother 71       alive - 24   He indicated that his mother is alive. He indicated that his father is deceased. He indicated that the status of his brother is unknown. He indicated that his maternal grandmother is deceased. He indicated that his maternal grandfather is deceased. He indicated that his paternal grandmother is deceased. He indicated that his paternal grandfather is deceased.   Social History    Social History   Socioeconomic History   Marital status: Single     Spouse name: Not on file   Number of children: 0   Years of education: Not on file   Highest education level: Not on file  Occupational History   Occupation: [employer]    Employer: [employer]  Tobacco Use   Smoking status: Never   Smokeless tobacco: Never  Vaping Use   Vaping Use: Never used  Substance and Sexual Activity   Alcohol use: Yes    Alcohol/week: 10.0 standard drinks of alcohol    Types: 10 Standard drinks or equivalent per week    Comment: 2 six packs a week, mostly on the weekends   Drug use: No   Sexual activity: Not on file  Other Topics Concern   Not on file  Social History Narrative   Lives in [place] by himself.  Works locally as a Psychiatrist.   Social Determinants of Health   Financial Resource Strain: Not on file  Food Insecurity: Not on file  Transportation Needs: Not on file  Physical Activity: Not on file  Stress: Not on file  Social Connections: Not on file  Intimate Partner Violence: Not on file     Review of Systems    General:  No chills, fever, night sweats or weight changes.  Cardiovascular:  No chest pain, dyspnea on exertion, edema, orthopnea, palpitations, paroxysmal nocturnal dyspnea. Dermatological: No rash, lesions/masses Respiratory: No cough, dyspnea Urologic: No hematuria, dysuria Abdominal:   No nausea, vomiting, diarrhea, bright red blood per rectum, melena, or hematemesis Neurologic:  No visual changes, weakness, changes in mental status. All other systems reviewed and are otherwise negative except as noted above.  Physical Exam    VS:  BP 117/76   Pulse (!) 59   Ht '5\' 10"'$  (1.778 m)   Wt 236 lb 9.6 oz (107.3 kg)   SpO2 99%   BMI 33.95 kg/m  , BMI Body mass index is 33.95 kg/m. GEN:  Well nourished, well developed, in no acute distress. HEENT: Normal. Neck: Supple, no JVD, carotid bruits, or masses. Cardiac: RRR, no murmurs, rubs, or gallops. No clubbing, cyanosis, edema.  Radials/DP/PT 2+  and equal bilaterally.  Respiratory:  Respirations regular and unlabored, clear to auscultation bilaterally. GI: Soft, nontender, nondistended. MS: No deformity or atrophy. Skin: Warm and dry, no rash. Neuro: Strength and sensation are intact. Psych: Normal affect.  Accessory Clinical Findings   Recent Labs: No results found for requested labs within last 365 days.   Recent Lipid Panel    Component Value Date/Time   CHOL 126 03/18/2020 0416   TRIG 138 03/18/2020 0416   HDL 44 03/18/2020 0416   CHOLHDL 2.9 03/18/2020 0416   VLDL 28 03/18/2020 0416   LDLCALC 54 03/18/2020 0416       ECG personally reviewed by me today: Sinus bradycardia, rate 59 bpm, incomplete RBBB, nonspecific ST and T wave abnormality.  No significant changes from 03/30/2021.   CHA2DS2-VASc Score = 2   This indicates a 2.2% annual risk of stroke. The patient's score is based upon: CHF History: 0 HTN History: 1 Diabetes History: 0 Stroke History: 0 Vascular Disease History: 1 Age Score: 0 Gender Score: 0      Assessment & Plan   CAD/anginal pain.  History of multiple coronary intervention.  Last [facility] February 2022 showed patent LAD and ramus stents, PCI with DES to proximal to mid CX.  Echo February 2022 showed EF 60 to 65%.  Patient reports he has occasional, brief "twinges" of pain similar to pain when he had MI and subsequent interventions. He reports about 2 months ago he had a 2-3 week time period when anginal pain was occurring every other day. This was not related to exertion and resolved on its own. Discussed nuclear stress testing vs Ranexa vs watchful monitoring. He is not interested in changing or starting a new medication. He would like to hold off on stress testing for now as he feels the increase in instances of pain is related to running out of calcium and magnesium. He will call the office if the pain returns. Educated patient on taking NTG and emergency precautions. He expressed  understanding. He admits to dietary indiscretion and decreased activity. Offered health weight management referral but he declined at this time. He will call if he changes his mind.  Continue atorvastatin, Zetia, carvedilol, Plavix, as needed SL NTG. PAF.  Event monitor June 2021 showed A-fib and atrial tachycardia.  He was started on Xarelto.  Patient in A-fib with RVR during hospital admission February 2022.  Converted on IV amiodarone and transitioned to p.o. amiodarone.  Patient reports occasional palpitations lasting seconds to minutes. No spontaneous bleeding issues. Continue amiodarone, carvedilol, and Xarelto.  Will check TSH and CMP today.  Will get chest xray. Eye exam has not been performed in over two years. Discussed the importance of regular eye exams. He states he will get one scheduled as soon as possible.  Hypertension.  BP today 117/76. Patient denies headaches or dizziness.  Continue carvedilol. Hyperlipidemia. Lipid panel has not been checked in 2 years. Will get a direct LDL today, as patient is not fasting. Continue atorvastatin.      Disposition: TSH, CMP, direct LDL today. Chest xray. Return in 1 year or sooner as needed.    Justice Britain. Jyssica Rief, DNP, NP-C     03/30/2022, 4:51 PM [facility] [address] Office [phone] Fax [phone]

## 2022-03-30 ENCOUNTER — Ambulatory Visit: Payer: 59 | Attending: Physician Assistant | Admitting: Student

## 2022-03-30 ENCOUNTER — Encounter: Payer: Self-pay | Admitting: Student

## 2022-03-30 VITALS — BP 117/76 | HR 59 | Ht 70.0 in | Wt 236.6 lb

## 2022-03-30 DIAGNOSIS — I480 Paroxysmal atrial fibrillation: Secondary | ICD-10-CM | POA: Diagnosis not present

## 2022-03-30 DIAGNOSIS — I25118 Atherosclerotic heart disease of native coronary artery with other forms of angina pectoris: Secondary | ICD-10-CM

## 2022-03-30 DIAGNOSIS — I2510 Atherosclerotic heart disease of native coronary artery without angina pectoris: Secondary | ICD-10-CM

## 2022-03-30 DIAGNOSIS — Z79899 Other long term (current) drug therapy: Secondary | ICD-10-CM | POA: Diagnosis not present

## 2022-03-30 DIAGNOSIS — E785 Hyperlipidemia, unspecified: Secondary | ICD-10-CM | POA: Diagnosis not present

## 2022-03-30 DIAGNOSIS — I10 Essential (primary) hypertension: Secondary | ICD-10-CM

## 2022-03-30 NOTE — Patient Instructions (Signed)
Medication Instructions:  Your physician recommends that you continue on your current medications as directed. Please refer to the Current Medication list given to you today.  *If you need a refill on your cardiac medications before your next appointment, please call your pharmacy*   Lab Work: TODAY:  CMET, TSH, & DIRECT LDL . If you have labs (blood work) drawn today and your tests are completely normal, you will receive your results only by: [place] (if you have MyChart) OR A paper copy in the mail If you have any lab test that is abnormal or we need to change your treatment, we will call you to review the results.   Testing/Procedures: A chest x-ray takes a picture of the organs and structures inside the chest, including the heart, lungs, and blood vessels. This test can show several things, including, whether the heart is enlarges; whether fluid is building up in the lungs; and whether pacemaker / defibrillator leads are still in place.  You can got to [place] before 4:30 for this. [place] PH:[phone]    Follow-Up: At [facility], you and your health needs are our priority.  As part of our continuing mission to provide you with exceptional heart care, we have created designated Provider Care Teams.  These Care Teams include your primary Cardiologist (physician) and Advanced Practice Providers (APPs -  Physician Assistants and Nurse Practitioners) who all work together to provide you with the care you need, when you need it.  We recommend signing up for the patient portal called "MyChart".  Sign up information is provided on this After Visit Summary.  MyChart is used to connect with patients for Virtual Visits (Telemedicine).  Patients are able to view lab/test results, encounter notes, upcoming appointments, etc.  Non-urgent messages can be sent to your provider as well.   To learn more about what you can do with MyChart, go to  [email or web address].    Your next appointment:   1 year(s)  Provider:   Lauree Chandler, MD     Other Instructions

## 2022-03-31 LAB — COMPREHENSIVE METABOLIC PANEL
ALT: 29 IU/L (ref 0–44)
AST: 32 IU/L (ref 0–40)
Albumin/Globulin Ratio: 1.9 (ref 1.2–2.2)
Albumin: 4.3 g/dL (ref 3.9–4.9)
Alkaline Phosphatase: 87 IU/L (ref 44–121)
BUN/Creatinine Ratio: 9 — ABNORMAL LOW (ref 10–24)
BUN: 11 mg/dL (ref 8–27)
Bilirubin Total: 0.8 mg/dL (ref 0.0–1.2)
CO2: 22 mmol/L (ref 20–29)
Calcium: 8.9 mg/dL (ref 8.6–10.2)
Chloride: 101 mmol/L (ref 96–106)
Creatinine, Ser: 1.29 mg/dL — ABNORMAL HIGH (ref 0.76–1.27)
Globulin, Total: 2.3 g/dL (ref 1.5–4.5)
Glucose: 82 mg/dL (ref 70–99)
Potassium: 4.1 mmol/L (ref 3.5–5.2)
Sodium: 138 mmol/L (ref 134–144)
Total Protein: 6.6 g/dL (ref 6.0–8.5)
eGFR: 63 mL/min/{1.73_m2} (ref >59)

## 2022-03-31 LAB — LDL CHOLESTEROL, DIRECT: LDL Direct: 60 mg/dL (ref 0–99)

## 2022-03-31 LAB — TSH: TSH: 3.68 u[IU]/mL (ref 0.450–4.500)

## 2022-04-04 ENCOUNTER — Ambulatory Visit
Admission: RE | Admit: 2022-04-04 | Discharge: 2022-04-04 | Disposition: A | Discharge status: Home / Self Care | Payer: 59 | Source: Ambulatory Visit | Attending: Student | Admitting: Student

## 2022-04-04 DIAGNOSIS — Z79899 Other long term (current) drug therapy: Secondary | ICD-10-CM

## 2022-04-04 DIAGNOSIS — I25118 Atherosclerotic heart disease of native coronary artery with other forms of angina pectoris: Secondary | ICD-10-CM

## 2022-04-04 DIAGNOSIS — I480 Paroxysmal atrial fibrillation: Secondary | ICD-10-CM

## 2022-04-04 DIAGNOSIS — E785 Hyperlipidemia, unspecified: Secondary | ICD-10-CM

## 2022-04-21 ENCOUNTER — Other Ambulatory Visit: Payer: Self-pay | Admitting: Cardiovascular Disease

## 2022-05-04 ENCOUNTER — Other Ambulatory Visit: Payer: Self-pay | Admitting: Cardiovascular Disease

## 2022-06-07 ENCOUNTER — Other Ambulatory Visit: Payer: Self-pay | Admitting: *Deleted

## 2022-06-07 DIAGNOSIS — I480 Paroxysmal atrial fibrillation: Secondary | ICD-10-CM

## 2022-06-07 MED ORDER — RIVAROXABAN 20 MG PO TABS: ORAL_TABLET | ORAL | 5 refills | Status: DC

## 2022-06-07 NOTE — Telephone Encounter (Signed)
Prescription refill request for Xarelto received.  Indication: afib  Last office visit: [place], 03/30/2022 Weight: 107.3 kg  Age: 62 yo  Scr: 1.29, 03/30/2022 CrCl: 91 ml/min   Refill sent

## 2022-06-10 ENCOUNTER — Other Ambulatory Visit: Payer: Self-pay | Admitting: Cardiovascular Disease

## 2022-06-24 IMAGING — DX DG CHEST 2V
2 series · 2 of 2 positions shown · non-contrast
Comparison: 03/17/2020

CLINICAL DATA: Amiodarone monitoring.

EXAM:
CHEST - 2 VIEW

[dg chest 2 view (1 of 2)]
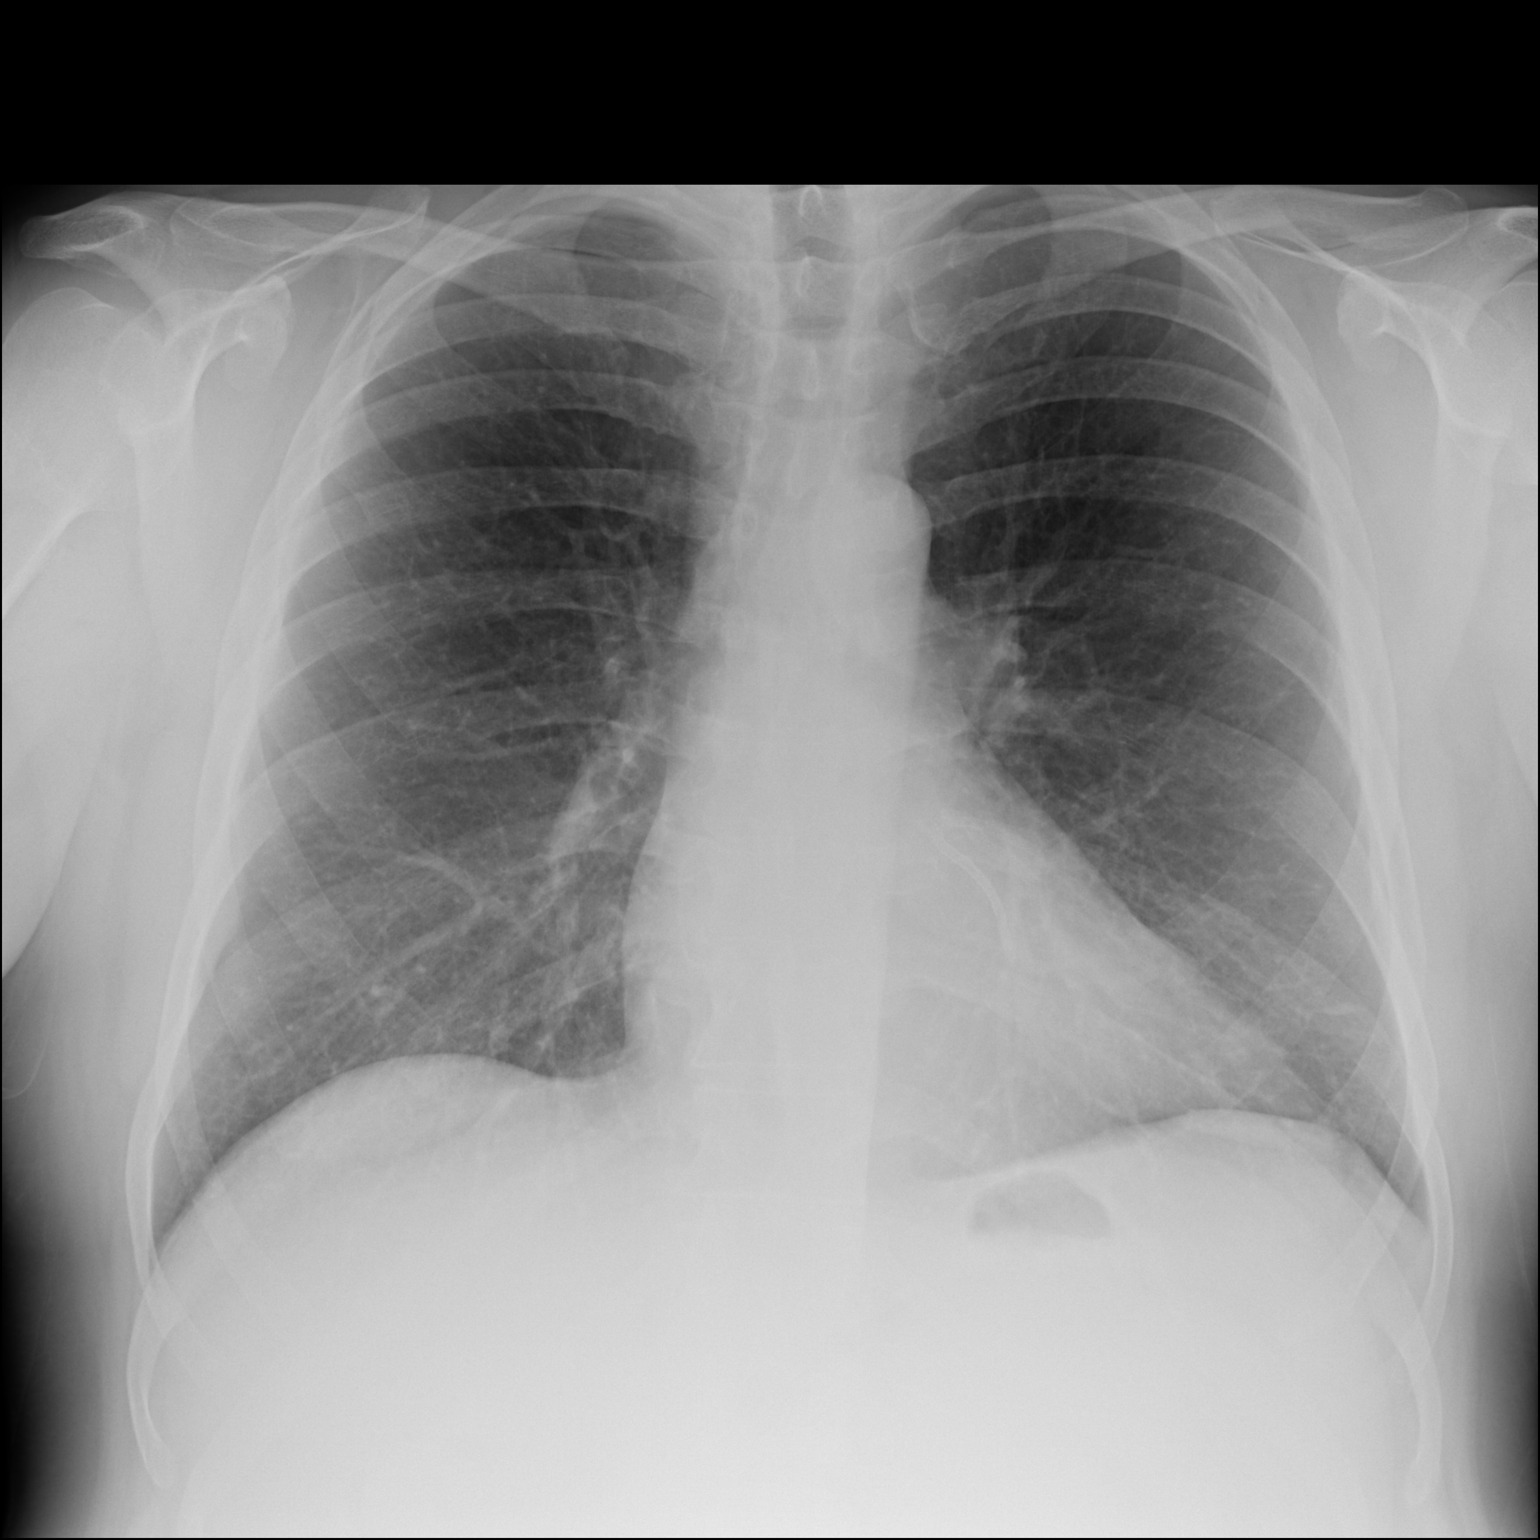

[dg chest 2 view (2 of 2)]
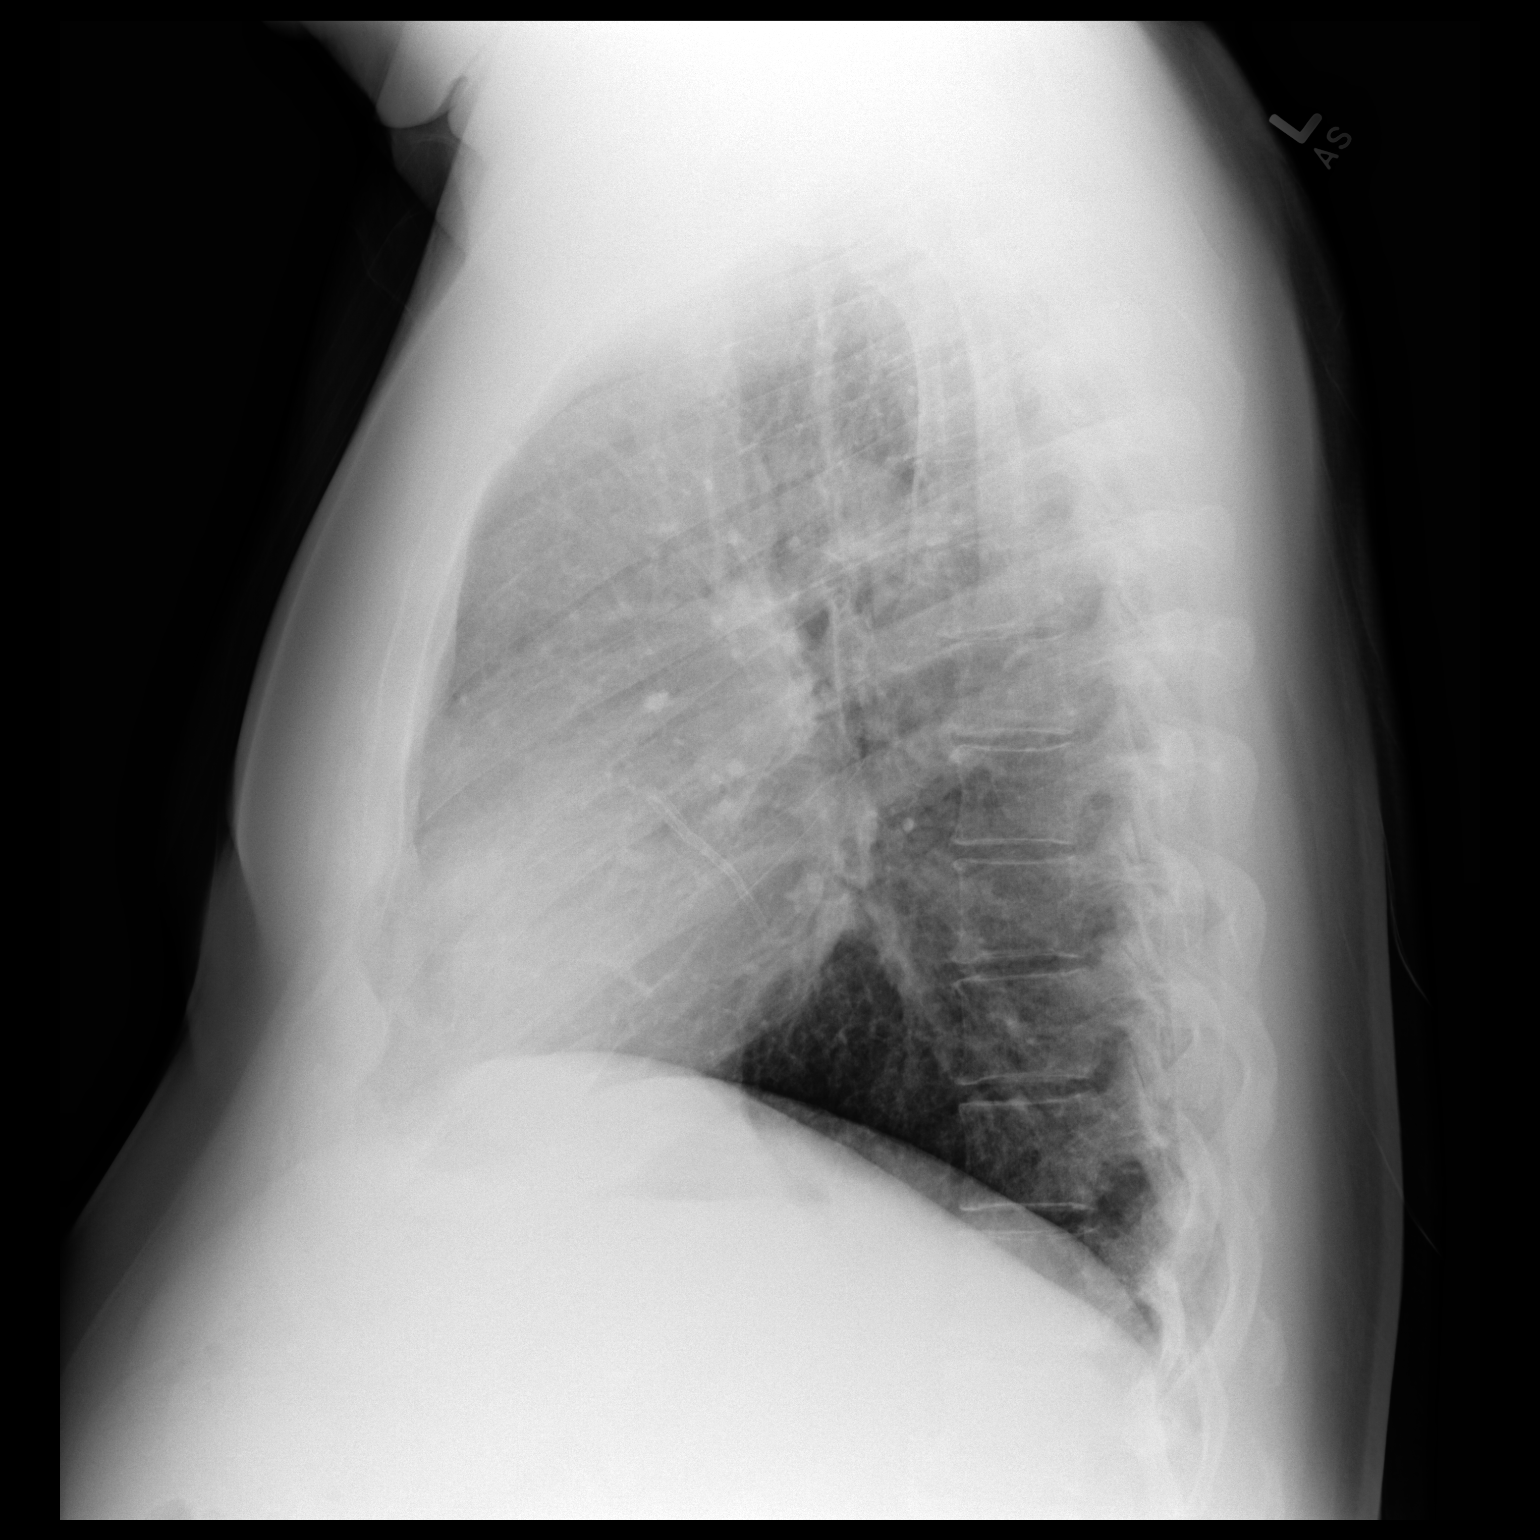

[2 of 2 positions shown; findings below may reference images not displayed]

FINDINGS: Cardiac silhouette and mediastinal contours are within normal
limits. A coronary artery stent is again visualized. The lungs are
clear. No pleural effusion or pneumothorax. No acute skeletal
abnormality.
IMPRESSION: No active cardiopulmonary disease.

## 2022-08-03 ENCOUNTER — Telehealth: Payer: Self-pay | Admitting: *Deleted

## 2022-08-03 NOTE — Telephone Encounter (Signed)
   Pre-operative Risk Assessment    Patient Name: BENANCIO Unity  DOB: 1960/10/05 MRN: [id number]     Request for Surgical Clearance    Procedure:  Dental Extraction - Amount of Teeth to be Pulled:  ONE TOOTH FOR EXTRACTION  Date of Surgery:  Clearance TBD                                 Surgeon:  DR. Salvadore Farber Surgeon's Group or Practice Name:  DR. Egbert Garibaldi, DDS Phone number:  [phone] Fax number:  [phone]   Type of Clearance Requested:   - Medical  - Pharmacy:  Hold Rivaroxaban (Xarelto)     Type of Anesthesia:  Not Indicated (LOCAL?)   Additional requests/questions:    Elpidio Anis   08/03/2022, 3:56 PM

## 2022-08-05 NOTE — Telephone Encounter (Signed)
   Patient Name: [name]  DOB: 1960-04-21 MRN: [id number]  Primary Cardiologist: Verne Carrow, MD  Chart reviewed as part of pre-operative protocol coverage.   Dental extractions of 1-2 teeth are considered low risk procedures per guidelines and generally do not require any specific cardiac clearance. It is also generally accepted that for extractions of 1-2 teeth and dental cleanings, there is no need to interrupt blood thinner therapy.  SBE prophylaxis is not required for the patient from a cardiac standpoint.  I will route this recommendation to the requesting party via Epic fax function and remove from pre-op pool.  Please call with questions.  Carlos Levering, NP 08/05/2022, 11:36 AM

## 2022-09-05 ENCOUNTER — Other Ambulatory Visit: Payer: Self-pay | Admitting: Cardiovascular Disease

## 2023-02-26 ENCOUNTER — Other Ambulatory Visit: Payer: Self-pay | Admitting: Cardiovascular Disease

## 2023-02-26 DIAGNOSIS — I480 Paroxysmal atrial fibrillation: Secondary | ICD-10-CM

## 2023-02-28 NOTE — Telephone Encounter (Signed)
Xarelto 20mg  refill request received. Pt is 63 years old, weight-107.3kg, Crea-1.29 on 03/30/22, last seen by [name] on 03/30/22 & pending appt 03/30/23 with [name], Diagnosis-Afib, CrCl-90.11 mL/min; Dose is appropriate based on dosing criteria. Will send in refill to requested pharmacy.

## 2023-03-01 ENCOUNTER — Other Ambulatory Visit: Payer: Self-pay | Admitting: Student

## 2023-03-30 ENCOUNTER — Ambulatory Visit: Payer: BC Managed Care – PPO | Attending: Physician Assistant | Admitting: Physician Assistant

## 2023-03-30 ENCOUNTER — Encounter: Payer: Self-pay | Admitting: Physician Assistant

## 2023-03-30 VITALS — BP 110/88 | HR 84 | Ht 70.0 in | Wt 221.0 lb

## 2023-03-30 DIAGNOSIS — E785 Hyperlipidemia, unspecified: Secondary | ICD-10-CM

## 2023-03-30 DIAGNOSIS — I10 Essential (primary) hypertension: Secondary | ICD-10-CM | POA: Diagnosis not present

## 2023-03-30 DIAGNOSIS — I480 Paroxysmal atrial fibrillation: Secondary | ICD-10-CM | POA: Diagnosis not present

## 2023-03-30 DIAGNOSIS — I25118 Atherosclerotic heart disease of native coronary artery with other forms of angina pectoris: Secondary | ICD-10-CM

## 2023-03-30 MED ORDER — EZETIMIBE 10 MG PO TABS: 10.0000 mg | ORAL_TABLET | Freq: Every day | ORAL | 3 refills | Status: AC

## 2023-03-30 MED ORDER — CARVEDILOL 12.5 MG PO TABS: 12.5000 mg | ORAL_TABLET | Freq: Two times a day (BID) | ORAL | 3 refills | Status: DC

## 2023-03-30 MED ORDER — CLOPIDOGREL BISULFATE 75 MG PO TABS: 75.0000 mg | ORAL_TABLET | Freq: Every day | ORAL | 3 refills | Status: AC

## 2023-03-30 NOTE — Progress Notes (Unsigned)
 Cardiology Office Note:  .   Date:  03/31/2023  ID:  [name], DOB Aug 11, 1960, MRN [id number] PCP: Tracey Harries, MD  [facility] Providers Cardiologist:  Verne Carrow, MD {  History of Present Illness: .   [name] is a 63 y.o. male with a past medical history of CABG, PAF on anticoagulation, hypertension, hyperlipidemia who presents for 1 year follow-up appointment.  Patient with the below medical profile:  CAD. LHC 04/19/2007 (STEMI): LCx 70-75%, RI 40/50%, mid RCA 30%. PCI with stent to mid LAD.  LHC 03/30/2011: Patent mid LAD stent. Proximal Cx 40%. Mid Cx into OM1 99%. Mid RCA 25%. PCI with DES x 2 overlapping mid Cx/OM1.  LHC 01/18/19 (positive stress test): Ramus intermedius 70-80%, proximal LCx 40%, mid RCA 60% (not hemodynamically significant DFR). Patent LAD and LCx stents. PCI with DES 2.25 x 24 mm to ramus intermedius.  LHC 03/18/2020 (NSTEMI): D1 30%, proximal to mid LD 30%, mid RCA 60%, ostial to proximal LCx 60%, proximal to mid LCx 99%. Patent LAD stent with minimal restenosis. Patent stent ramus. PCI with DES 2.75 x 38 mm to proximal to mid Cx.  Echo 03/18/2020: EF 60-65%. Trivial MR.  PAF. Event monitor 07/12/2019: Sinus rhythm. Afib vs atrial tachycardia. PACs. PVCs.  Afib with RVR during hospital admission February 2022. Converted on IV amiodarone. Transitioned to PO amiodarone prior to discharge.  Hypertension.  Hyperlipidemia.     He was initially seen by Dr. Clifton James 03/30/2021 and at that time was doing well.  No changes to his medications.  When he was seen last year he was having occasional anginal pain that occurred as achy discomfort in bilateral upper chest and arms.  About 2 months prior to his appointment he was experiencing the same about anginal pain about every other day for about 2 to 3 weeks.  It would travel to different spots throughout the day lasting for about 10 to 30 minutes and resolving on its own.  He tried nitroglycerin 1 time  did not think it helped so I did not try again.  Pain typically came on during rest and did not worsen with exertion.  Pain did not limit his activity and did not come on with walking.  Noticed that he had run out of his calcium and magnesium then as soon as he restarted these his pain resolved.  At that appointment, nuclear stress testing versus Ranexa versus watchful monitoring were all discussed.  He was not interested in changing or starting any new medication.  He wanted to hold off on stress testing for now.  Occasional palpitations but reports that the episodes were sporadic and do not last long.  He does have a treadmill at home and if intermittent.  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  Has been taking on more administrative responsibilities and admits to feeling a bit more stressed.  Today, he presents with a history of cardiac issues including stent placement and atrial fibrillation, with occasional chest discomfort and arm pain. The pain is described as moving around and is not associated with any specific activity. The patient also reports episodes of lightheadedness and sweating, which occur a few times a month and last for about 15-20 minutes. The patient denies any severe episodes like the one that occurred two years ago, which led to the stent placement.  In addition to the cardiac symptoms, the patient also reports a history of sinus infection and  dental issues. The patient had a severe sinus infection last year that required two rounds of antibiotics. The patient also had multiple dental issues including a chipped tooth, a broken tooth that required extraction, and a cavity. The patient hopes to have a better health year this year.  The patient is currently on multiple medications including Plavix, Xarelto, and amiodarone. The patient reports that the amiodarone seems to control the heart rhythm well. The patient is also on Plavix and  Xarelto for stroke prevention.  Reports no shortness of breath nor dyspnea on exertion.  No edema, orthopnea, PND.    ROS: Pertinent ROS in HPI  Studies Reviewed: Marland Kitchen   EKG Interpretation Date/Time:  Thursday March 30 2023 15:34:54 EST Ventricular Rate:  77 PR Interval:    QRS Duration:  90 QT Interval:  414 QTC Calculation: 468 R Axis:   97  Text Interpretation: Atrial flutter with variable A-V block Rightward axis When compared with ECG of 19-Mar-2020 07:33, Atrial flutter has replaced Sinus rhythm Nonspecific T wave abnormality now evident in Inferior leads T wave amplitude has decreased in Anterior leads Confirmed by Jari Favre [id number] on 03/30/2023 4:52:21 PM     Cardiac catheterization 03/18/2020  1st Diag lesion is 30% stenosed. Prox LAD to Mid LAD lesion is 30% stenosed. Ost Cx to Prox Cx lesion is 60% stenosed. Mid RCA lesion is 60% stenosed. Previously placed Ramus drug eluting stent is widely patent. Prox Cx to Mid Cx lesion is 99% stenosed. A drug-eluting stent was successfully placed using a STENT RESOLUTE ONYX [id number]. Post intervention, there is a 0% residual stenosis. Post intervention, there is a 0% residual stenosis.   1. Patent mid LAD stent with minimal restenosis 2. Patent intermediate stent 3. Moderately severe proximal Circumflex stenosis. Patent mid Circumflex stent with severe restenosis within the old stent.  4. Successful PTCA/DES x 1 proximal and mid Circumflex 5. Moderate mid RCA stenosis angiographically unchanged since cath in 2020.    Recommendations: Continue DAPT with ASA and Plavix for at least one month. Consider stopping ASA in one month given the fact that he will be on Xarelto as well. Resume Xarelto tomorrow.  Risk Assessment/Calculations:    CHA2DS2-VASc Score = 2   This indicates a 2.2% annual risk of stroke. The patient's score is based upon: CHF History: 0 HTN History: 1 Diabetes History: 0 Stroke History: 0 Vascular Disease  History: 1 Age Score: 0 Gender Score: 0       Physical Exam:   VS:  BP 110/88   Pulse 84   Ht 5\' 10"  (1.778 m)   Wt 221 lb (100.2 kg)   SpO2 98%   BMI 31.71 kg/m    Wt Readings from Last 3 Encounters:  03/30/23 221 lb (100.2 kg)  03/30/22 236 lb 9.6 oz (107.3 kg)  03/30/21 230 lb 3.7 oz (104.4 kg)    GEN: Well nourished, well developed in no acute distress NECK: No JVD; No carotid bruits CARDIAC: RRR, no murmurs, rubs, gallops RESPIRATORY:  Clear to auscultation without rales, wheezing or rhonchi  ABDOMEN: Soft, non-tender, non-distended EXTREMITIES:  No edema; No deformity   ASSESSMENT AND PLAN: .   Coronary Artery Disease History of stent placement in the circumflex artery in 2022. Reports intermittent, non-exertional, and non-radiating chest discomfort that is atypical for angina. No recent changes in symptoms. -Continue current medications including Plavix and Xarelto. -Consider stress testing if symptoms become exertional or consistent with angina.  Atrial Fibrillation/Aflutter Aflutter, rate controlled on  EKG today Reports occasional episodes of lightheadedness and sweating, possibly related to arrhythmia. Currently on Amiodarone for rhythm control. -Continue Amiodarone and monitor for side effects. -Recommend regular eye exams with an ophthalmologist due to potential ocular side effects of Amiodarone.  Hypertension Blood pressure slightly elevated at recent visit. -Increase Carvedilol dose to 12.5mg  BID -Recommend patient obtain a home blood pressure monitor to track blood pressure and heart rate.  Lower Extremity Edema Reports occasional lower extremity swelling, worse with prolonged sitting or standing. -Recommend trial of compression stockings. -Provide information for local company, [employer], for custom compression stockings.  Medication Cost Patient reports high cost of Xarelto. -Provide information for Xarelto savings card program to help offset  cost.  General Health Maintenance -Continue regular follow-up with primary care provider and cardiologist. -Ensure regular dental care and eye exams.      Dispo: He can follow-up in a year or sooner if needed.   Signed, Sharlene Dory, PA-C

## 2023-03-30 NOTE — Patient Instructions (Addendum)
 Medication Instructions:  Refilled medications Increase Carvedilol to 12.5 mg twice daily *If you need a refill on your cardiac medications before your next appointment, please call your pharmacy*   Follow-Up: At [facility], you and your health needs are our priority.  As part of our continuing mission to provide you with exceptional heart care, we have created designated Provider Care Teams.  These Care Teams include your primary Cardiologist (physician) and Advanced Practice Providers (APPs -  Physician Assistants and Nurse Practitioners) who all work together to provide you with the care you need, when you need it.  We recommend signing up for the patient portal called "MyChart".  Sign up information is provided on this After Visit Summary.  MyChart is used to connect with patients for Virtual Visits (Telemedicine).  Patients are able to view lab/test results, encounter notes, upcoming appointments, etc.  Non-urgent messages can be sent to your provider as well.   To learn more about what you can do with MyChart, go to [email or web address].    Your next appointment:   1 year(s)  Provider:   Clifton James, MD  Other Instructions  [employer] [address] [place], [place] [address] [phone]  [email or web address]      1st Floor: - Lobby - Registration  - Pharmacy  - Lab - Cafe  2nd Floor: - PV Lab - Diagnostic Testing (echo, CT, nuclear med)  3rd Floor: - Vacant  4th Floor: - TCTS (cardiothoracic surgery) - AFib Clinic - Structural Heart Clinic - Vascular Surgery  - Vascular Ultrasound  5th Floor: - [facility]Care Cardiology (general and EP) - Clinical Pharmacy for coumadin, hypertension, lipid, weight-loss medications, and med management appointments    Valet parking services will be available as well.

## 2023-05-01 ENCOUNTER — Other Ambulatory Visit: Payer: Self-pay

## 2023-05-01 MED ORDER — AMIODARONE HCL 200 MG PO TABS: 200.0000 mg | ORAL_TABLET | Freq: Every day | ORAL | 3 refills | Status: AC

## 2023-05-01 MED ORDER — ATORVASTATIN CALCIUM 80 MG PO TABS: 80.0000 mg | ORAL_TABLET | Freq: Every day | ORAL | 3 refills | Status: AC

## 2023-05-01 NOTE — Addendum Note (Signed)
 Addended by: Margaret Pyle D on: 05/01/2023 10:37 AM   Modules accepted: Orders

## 2023-11-26 ENCOUNTER — Other Ambulatory Visit: Payer: Self-pay | Admitting: Cardiovascular Disease

## 2023-11-26 DIAGNOSIS — I480 Paroxysmal atrial fibrillation: Secondary | ICD-10-CM

## 2023-11-27 NOTE — Telephone Encounter (Signed)
 Prescription refill request for Xarelto  received.  Indication: AF Last office visit: 2/25 [place] Weight: 100.2  Age:63 Scr: 0.97 CrCl: 110

## 2024-01-09 ENCOUNTER — Other Ambulatory Visit: Payer: Self-pay | Admitting: Urology

## 2024-01-09 DIAGNOSIS — R972 Elevated prostate specific antigen [PSA]: Secondary | ICD-10-CM

## 2024-01-10 ENCOUNTER — Encounter: Payer: Self-pay | Admitting: Urology

## 2024-02-27 ENCOUNTER — Other Ambulatory Visit: Payer: Self-pay | Admitting: Physician Assistant

## 2024-02-29 LAB — COLOGUARD: COLOGUARD: NEGATIVE

## 2024-03-20 ENCOUNTER — Ambulatory Visit: Admitting: Cardiovascular Disease
